# Patient Record
Sex: Female | Born: 2000 | Race: Black or African American | Hispanic: No | Marital: Single | Smoking: Never smoker
Health system: Southern US, Community
[De-identification: ages and names within clinical notes are randomized; demographics above are authoritative.]

## PROBLEM LIST (undated history)

## (undated) DIAGNOSIS — Z789 Other specified health status: Secondary | ICD-10-CM

## (undated) DIAGNOSIS — J302 Other seasonal allergic rhinitis: Secondary | ICD-10-CM

## (undated) HISTORY — DX: Other specified health status: Z78.9

---
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
## 2021-03-21 ENCOUNTER — Ambulatory Visit: Payer: Medicaid Other | Admitting: Adult Health

## 2021-04-23 NOTE — L&D Delivery Note (Signed)
Delivery Note CNM to BS for FHR decels. 10/100/+2. Pushed well x 2- minutes. NICU at delivery for severe FGR, abnormal dopplers and decels. At 4:40 PM a viable and healthy female was delivered via Vaginal, Spontaneous (Presentation: Right Occiput Anterior).  APGAR: 7, 8; weight 3 lb 10.2 oz (1650 g). Shoulders delivered easily. Infant placed skin-to-skin w/ mom. Delayed cord clamping x 1 minute. Cord clamped x 2 and cut by mom. Baby handed off to NICU team due to small size. Placenta status: Spontaneous; Pathology, Intact.  Cord: 3 vessels with the following complications:   Anesthesia: Epidural Episiotomy: None Lacerations: None Suture Repair:  NA Est. Blood Loss (mL): 151  Mom to postpartum.  Baby to NICU. Placenta to: Pathology Feeding: Br/Bo Circ: NA Contraception: Depo  [place] 02/13/2022, 5:43 PM

## 2021-05-10 ENCOUNTER — Encounter (HOSPITAL_COMMUNITY): Payer: Self-pay

## 2021-05-10 ENCOUNTER — Emergency Department (HOSPITAL_COMMUNITY)
Admission: EM | Admit: 2021-05-10 | Discharge: 2021-05-10 | Disposition: A | Discharge status: Home / Self Care | Payer: Medicaid Other | Attending: Emergency Medicine | Admitting: Emergency Medicine

## 2021-05-10 ENCOUNTER — Other Ambulatory Visit: Payer: Self-pay

## 2021-05-10 DIAGNOSIS — B349 Viral infection, unspecified: Secondary | ICD-10-CM | POA: Insufficient documentation

## 2021-05-10 DIAGNOSIS — R519 Headache, unspecified: Secondary | ICD-10-CM | POA: Diagnosis present

## 2021-05-10 DIAGNOSIS — H00011 Hordeolum externum right upper eyelid: Secondary | ICD-10-CM | POA: Diagnosis not present

## 2021-05-10 DIAGNOSIS — Z20822 Contact with and (suspected) exposure to covid-19: Secondary | ICD-10-CM | POA: Insufficient documentation

## 2021-05-10 DIAGNOSIS — R000 Tachycardia, unspecified: Secondary | ICD-10-CM | POA: Diagnosis not present

## 2021-05-10 LAB — RESP PANEL BY RT-PCR (FLU A&B, COVID) ARPGX2
Influenza A by PCR: NEGATIVE
Influenza B by PCR: NEGATIVE
SARS Coronavirus 2 by RT PCR: NEGATIVE

## 2021-05-10 MED ORDER — ERYTHROMYCIN 5 MG/GM OP OINT
TOPICAL_OINTMENT | Freq: Once | OPHTHALMIC | Status: AC
  Administered 2021-05-10: 1 via OPHTHALMIC
  Filled 2021-05-10: qty 3.5

## 2021-05-10 NOTE — Discharge Instructions (Addendum)
Please refer to the attached instructions. Apply ointment to eye 4-6 times per day. Tylenol or ibuprofen for headache/discomfort.

## 2021-05-10 NOTE — ED Provider Notes (Signed)
[facility] EMERGENCY DEPARTMENT Provider Note   CSN: [id number] Arrival date & time: 05/10/21  1946     History  Chief Complaint  Patient presents with   Headache    Monica Kramer is a 21 y.o. female.  Patient presents with complaint of a stye to the right upper eyelid, onset several days ago. Today she developed a headache, chills, and malaise. No known sick contacts.  The history is provided by the patient. No language interpreter was used.  Headache Pain location:  Generalized Quality:  Dull Onset quality:  Gradual Duration:  1 day Progression:  Waxing and waning Chronicity:  New Associated symptoms: fever and nausea   Associated symptoms: no blurred vision, no cough, no diarrhea, no sore throat and no vomiting       Home Medications Prior to Admission medications   Medication Sig Start Date End Date Taking? Authorizing Provider  albuterol (VENTOLIN HFA) 108 (90 Base) MCG/ACT inhaler Inhale 1-2 puffs into the lungs every 6 (six) hours as needed for wheezing or shortness of breath. 02/04/20   Long, Wonda Olds, MD  metroNIDAZOLE (FLAGYL) 500 MG tablet Take 1 tablet (500 mg total) by mouth 2 (two) times daily. 09/14/19   Estill Dooms, NP  ofloxacin (OCUFLOX) 0.3 % ophthalmic solution Place 1 drop into the right eye 4 (four) times daily. 02/08/20   Avegno, Darrelyn Hillock, FNP      Allergies    Tomato    Review of Systems   Review of Systems  Constitutional:  Positive for fever.  HENT:  Negative for sore throat.   Eyes:  Negative for blurred vision.  Respiratory:  Negative for cough.   Gastrointestinal:  Positive for nausea. Negative for diarrhea and vomiting.  Neurological:  Positive for headaches.  All other systems reviewed and are negative.  Physical Exam Updated Vital Signs BP 137/81 (BP Location: Right Arm)    Pulse (!) 104    Temp 99.3 F (37.4 C) (Oral)    Resp 18    Ht 5\' 3"  (1.6 m)    Wt 67.1 kg    LMP  (LMP Unknown)    SpO2 100%    BMI 26.22 kg/m   Physical Exam Constitutional:      Appearance: Normal appearance.  HENT:     Head: Normocephalic.     Mouth/Throat:     Mouth: Mucous membranes are moist.  Eyes:     Conjunctiva/sclera: Conjunctivae normal.     Comments: Stye right upper lid.  Cardiovascular:     Rate and Rhythm: Tachycardia present.     Pulses: Normal pulses.  Pulmonary:     Effort: Pulmonary effort is normal.     Breath sounds: Normal breath sounds.  Abdominal:     Palpations: Abdomen is soft.  Musculoskeletal:        General: Normal range of motion.  Lymphadenopathy:     Cervical: No cervical adenopathy.  Skin:    General: Skin is warm and dry.  Neurological:     Mental Status: She is alert and oriented to person, place, and time.     Motor: Motor function is intact.     Coordination: Coordination is intact.  Psychiatric:        Mood and Affect: Mood normal.        Behavior: Behavior normal.    ED Results / Procedures / Treatments   Labs (all labs ordered are listed, but only abnormal results are displayed) Labs Reviewed  RESP PANEL  BY RT-PCR (FLU A&B, COVID) [id number]    EKG None  Radiology No results found.  Procedures Procedures    Medications Ordered in ED Medications  erythromycin ophthalmic ointment (1 application Right Eye Given 05/10/21 2151)    ED Course/ Medical Decision Making/ A&P                           Medical Decision Making Labs reviewed. Patient negative for COVID, influenza, and RSV.  Mild tachycardia likely due to fever. No chest pain or shortness of breath.  Patient with stye to right upper lid. No vision changes. Initiated erythromycin ointment. Care instructions provided.  Pt symptoms consistent with viral illness.  Pt will be discharged with symptomatic treatment.  Discussed return precautions.  Pt is hemodynamically stable & in NAD prior to discharge.         Final Clinical Impression(s) / ED Diagnoses Final diagnoses:  Viral infection  Hordeolum  externum of right upper eyelid    Rx / DC Orders ED Discharge Orders     None         Etta Quill, NP 05/10/21 2207    Lajean Saver, MD 05/10/21 2256

## 2021-05-10 NOTE — ED Triage Notes (Signed)
Pt states that she developed a stye to the R eye a few days ago and has not gotten any better. Pt states that today she started developing a headache and "being hot and cold". Denies fever but states that she has not felt good today. Denies any sick interactions.

## 2021-05-11 ENCOUNTER — Telehealth: Payer: Self-pay

## 2021-05-11 NOTE — Telephone Encounter (Signed)
Transition Care Management Unsuccessful Follow-up Telephone Call ° °Date of discharge and from where:  01/18//2023 from [place] ° °Attempts:  1st Attempt ° °Reason for unsuccessful TCM follow-up call:  Left voice message ° ° ° °

## 2021-05-12 NOTE — Telephone Encounter (Signed)
Transition Care Management Follow-up Telephone Call Date of discharge and from where: 05/10/2021 from [facility] How have you been since you were released from the hospital? Pt stated that she is feeling the same. Pt stated that she has not had any fever but has started coughing and she is still experiencing fatigue. OTC meds taking is tylenol and benadryl.  Any questions or concerns? No  Items Reviewed: Did the pt receive and understand the discharge instructions provided? Yes  Medications obtained and verified? Yes  Other? No  Any new allergies since your discharge? No  Dietary orders reviewed? No Do you have support at home? Yes   Functional Questionnaire: (I = Independent and D = Dependent) ADLs: I  Bathing/Dressing- I  Meal Prep- I  Eating- I  Maintaining continence- I  Transferring/Ambulation- I  Managing Meds- I   Follow up appointments reviewed:  PCP Hospital f/u appt confirmed? No   Specialist Hospital f/u appt confirmed? No   Are transportation arrangements needed? No  If their condition worsens, is the pt aware to call PCP or go to the Emergency Dept.? Yes Was the patient provided with contact information for the PCP's office or ED? Yes Was to pt encouraged to call back with questions or concerns? Yes

## 2021-05-13 ENCOUNTER — Emergency Department (HOSPITAL_COMMUNITY)
Admission: EM | Admit: 2021-05-13 | Discharge: 2021-05-13 | Disposition: A | Discharge status: Home / Self Care | Payer: Medicaid Other | Attending: Emergency Medicine | Admitting: Emergency Medicine

## 2021-05-13 ENCOUNTER — Encounter (HOSPITAL_COMMUNITY): Payer: Self-pay | Admitting: *Deleted

## 2021-05-13 DIAGNOSIS — B9789 Other viral agents as the cause of diseases classified elsewhere: Secondary | ICD-10-CM | POA: Diagnosis not present

## 2021-05-13 DIAGNOSIS — Z3202 Encounter for pregnancy test, result negative: Secondary | ICD-10-CM | POA: Insufficient documentation

## 2021-05-13 DIAGNOSIS — R000 Tachycardia, unspecified: Secondary | ICD-10-CM | POA: Insufficient documentation

## 2021-05-13 DIAGNOSIS — R519 Headache, unspecified: Secondary | ICD-10-CM | POA: Diagnosis present

## 2021-05-13 DIAGNOSIS — J069 Acute upper respiratory infection, unspecified: Secondary | ICD-10-CM | POA: Insufficient documentation

## 2021-05-13 DIAGNOSIS — R590 Localized enlarged lymph nodes: Secondary | ICD-10-CM | POA: Diagnosis not present

## 2021-05-13 LAB — GROUP A STREP BY PCR: Group A Strep by PCR: NOT DETECTED

## 2021-05-13 MED ORDER — BENZONATATE 200 MG PO CAPS: 200.0000 mg | ORAL_CAPSULE | Freq: Three times a day (TID) | ORAL | 0 refills | Status: AC

## 2021-05-13 NOTE — ED Notes (Signed)
URINE Pregnancy is NEGATIVE

## 2021-05-13 NOTE — ED Provider Notes (Signed)
[facility] EMERGENCY DEPARTMENT Provider Note   CSN: [id number] Arrival date & time: 05/13/21  1109     History  No chief complaint on file.   Monica Kramer is a 21 y.o. female.  21 year old female presents emergency room with complaint of sore throat with headache, congestion.  States her symptoms started a few days ago with cough and congestion, came to the ER and had a negative COVID/flu test.  Patient is not taking anything for her symptoms beyond ibuprofen.  States that she is unable to go to work due to feeling unwell.  Denies history of chronic lung disease or other medical history.      Home Medications Prior to Admission medications   Medication Sig Start Date End Date Taking? Authorizing Provider  benzonatate (TESSALON) 200 MG capsule Take 1 capsule (200 mg total) by mouth every 8 (eight) hours for 10 days. 05/13/21 05/23/21 Yes Jeannie Fend, PA-C  albuterol (VENTOLIN HFA) 108 (90 Base) MCG/ACT inhaler Inhale 1-2 puffs into the lungs every 6 (six) hours as needed for wheezing or shortness of breath. 02/04/20   Long, Arlyss Repress, MD  metroNIDAZOLE (FLAGYL) 500 MG tablet Take 1 tablet (500 mg total) by mouth 2 (two) times daily. 09/14/19   Adline Potter, NP  ofloxacin (OCUFLOX) 0.3 % ophthalmic solution Place 1 drop into the right eye 4 (four) times daily. 02/08/20   Avegno, Zachery Dakins, FNP      Allergies    Tomato    Review of Systems   Review of Systems  Constitutional:  Negative for chills and fever.  HENT:  Positive for congestion and sore throat.   Respiratory:  Positive for cough.   Gastrointestinal:  Negative for nausea and vomiting.  Musculoskeletal:  Negative for arthralgias and myalgias.  Skin:  Negative for rash and wound.  Allergic/Immunologic: Negative for immunocompromised state.  Neurological:  Positive for headaches.  Hematological:  Positive for adenopathy.  Psychiatric/Behavioral:  Negative for confusion.   All other systems reviewed and  are negative.  Physical Exam Updated Vital Signs BP 139/89 (BP Location: Right Arm)    Pulse 99    Temp 98.9 F (37.2 C) (Oral)    Resp 16    LMP  (LMP Unknown)    SpO2 100%  Physical Exam Vitals and nursing note reviewed.  Constitutional:      General: She is not in acute distress.    Appearance: She is well-developed. She is not diaphoretic.  HENT:     Head: Normocephalic and atraumatic.     Right Ear: Tympanic membrane and ear canal normal.     Left Ear: Tympanic membrane and ear canal normal.     Nose: Congestion present.     Mouth/Throat:     Mouth: Mucous membranes are moist.     Pharynx: Oropharyngeal exudate and posterior oropharyngeal erythema present.     Comments: Erythematous oropharynx and tonsils with slight exudate noted to right tonsil. Eyes:     Conjunctiva/sclera: Conjunctivae normal.  Neck:     Comments: Mildly tender left anterior cervical lymphadenopathy Cardiovascular:     Rate and Rhythm: Regular rhythm. Tachycardia present.     Heart sounds: Normal heart sounds.  Pulmonary:     Effort: Pulmonary effort is normal.     Breath sounds: Normal breath sounds.  Musculoskeletal:     Cervical back: Neck supple.  Lymphadenopathy:     Cervical: Cervical adenopathy present.  Skin:    General: Skin is warm and  dry.     Findings: No erythema or rash.  Neurological:     Mental Status: She is alert and oriented to person, place, and time.  Psychiatric:        Behavior: Behavior normal.    ED Results / Procedures / Treatments   Labs (all labs ordered are listed, but only abnormal results are displayed) Labs Reviewed  GROUP A STREP BY PCR  PREGNANCY, URINE    EKG None  Radiology No results found.  Procedures Procedures    Medications Ordered in ED Medications - No data to display  ED Course/ Medical Decision Making/ A&P                           Medical Decision Making Amount and/or Complexity of Data Reviewed Labs:  ordered.  Risk Prescription drug management.   21 year old female with complaint of sore throat, cough, congestion as above.  On exam, is found to have erythematous tonsils with slight exudate to right tonsil with tender left anterior lymph node however strep test is negative.  Patient seen a few days ago with negative COVID and flu test.  Not taking anything OTC and states she could possibly be pregnant.  Recommend pregnancy test and vitals reassessed prior to discharge with recommendations for management of her likely viral URI.        Final Clinical Impression(s) / ED Diagnoses Final diagnoses:  Viral URI with cough  Negative pregnancy test    Rx / DC Orders ED Discharge Orders          Ordered    benzonatate (TESSALON) 200 MG capsule  Every 8 hours        05/13/21 1606              Jeannie Fend, PA-C 05/13/21 1607    Linwood Dibbles, MD 05/15/21 1221

## 2021-05-13 NOTE — Discharge Instructions (Addendum)
Home to rest.  Recommend NyQuil and DayQuil for cold symptoms.  If you need additional cough relief, prescription for Kimberlee Nearing has been sent to your pharmacy. Sure to drink plenty of hydrating fluids.  Recheck with your doctor if not improving.

## 2021-05-13 NOTE — ED Triage Notes (Signed)
Sore throat onset yesterday

## 2021-05-15 ENCOUNTER — Telehealth: Payer: Self-pay

## 2021-05-15 NOTE — Telephone Encounter (Signed)
Transition Care Management Unsuccessful Follow-up Telephone Call ° °Date of discharge and from where:  05/13/2021-[facility]  ° °Attempts:  1st Attempt ° °Reason for unsuccessful TCM follow-up call:  Unable to reach patient ° °  °

## 2021-05-16 NOTE — Telephone Encounter (Signed)
Transition Care Management Follow-up Telephone Call Date of discharge and from where: 05/14/2020 from [facility].  How have you been since you were released from the hospital? Pt stated that she was feeling some better. Pt was sleeping when I called.  Any questions or concerns? No  Items Reviewed: Did the pt receive and understand the discharge instructions provided? No  Medications obtained and verified? No  Other? No  Any new allergies since your discharge? No  Dietary orders reviewed? No Do you have support at home?  Not asked  Functional Questionnaire: (I = Independent and D = Dependent) ADLs: I  Bathing/Dressing- I  Meal Prep- I  Eating- I  Maintaining continence- I  Transferring/Ambulation- I  Managing Meds- I   Follow up appointments reviewed:  PCP Hospital f/u appt confirmed? No  Asked patient to call me back if she was interested in est. Care.  Specialist Hospital f/u appt confirmed? No  Are transportation arrangements needed? No  If their condition worsens, is the pt aware to call PCP or go to the Emergency Dept.? Yes Was the patient provided with contact information for the PCP's office or ED? Yes Was to pt encouraged to call back with questions or concerns? Yes

## 2021-07-13 ENCOUNTER — Other Ambulatory Visit: Payer: Medicaid Other

## 2021-07-23 ENCOUNTER — Emergency Department (HOSPITAL_COMMUNITY)
Admission: EM | Admit: 2021-07-23 | Discharge: 2021-07-23 | Disposition: A | Discharge status: Home / Self Care | Payer: Medicaid Other | Attending: Emergency Medicine | Admitting: Emergency Medicine

## 2021-07-23 ENCOUNTER — Encounter (HOSPITAL_COMMUNITY): Payer: Self-pay | Admitting: Emergency Medicine

## 2021-07-23 ENCOUNTER — Other Ambulatory Visit: Payer: Self-pay

## 2021-07-23 DIAGNOSIS — O219 Vomiting of pregnancy, unspecified: Secondary | ICD-10-CM | POA: Diagnosis not present

## 2021-07-23 DIAGNOSIS — Z3A01 Less than 8 weeks gestation of pregnancy: Secondary | ICD-10-CM | POA: Diagnosis not present

## 2021-07-23 LAB — I-STAT BETA HCG BLOOD, ED (MC, WL, AP ONLY): I-stat hCG, quantitative: >2000 m[IU]/mL — ABNORMAL HIGH (ref <5)

## 2021-07-23 MED ORDER — ONDANSETRON HCL 4 MG/2ML IJ SOLN: 4.0000 mg | Freq: Once | INTRAMUSCULAR | Status: DC

## 2021-07-23 MED ORDER — SODIUM CHLORIDE 0.9 % IV BOLUS: 1000.0000 mL | Freq: Once | INTRAVENOUS | Status: DC

## 2021-07-23 MED ORDER — ONDANSETRON 8 MG PO TBDP
8.0000 mg | ORAL_TABLET | Freq: Once | ORAL | Status: AC
  Administered 2021-07-23: 8 mg via ORAL
  Filled 2021-07-23: qty 1

## 2021-07-23 MED ORDER — ONDANSETRON 4 MG PO TBDP: 4.0000 mg | ORAL_TABLET | Freq: Three times a day (TID) | ORAL | 0 refills | Status: DC | PRN

## 2021-07-23 NOTE — Discharge Instructions (Signed)
It is important that you follow-up at our local OB, gynecology office.  Return here for concerning changes in your condition. ?

## 2021-07-23 NOTE — ED Triage Notes (Signed)
Pt reports she is unsure when her last period was but believes it may have been Feb; pt reports emesis the past 2 mornings, reports she had a positive home pregnancy test x 1 week ago; has not seen OBGYN  ?

## 2021-07-23 NOTE — ED Provider Notes (Signed)
?[facility] ?Provider Note ? ? ?CSN: [id number] ?Arrival date & time: 07/23/21  1217 ? ?  ? ?History ? ?Chief Complaint  ?Patient presents with  ? Possible Pregnancy  ? ? ?Monica Kramer is a 21 y.o. female. ? ?HPI ?Patient presents with concern of nausea, anorexia, occasional vomiting.  Last menstrual period was about 6 weeks ago.  She hypothesizes about pregnancy.  She has no pain, no lightheadedness, no dysuria, no urinary frequency.  She notes that over the past few days that she has had episodic nausea, vomited yesterday.  She is not taking any medication for relief, has not seen her physician. ? ?  ? ?Home Medications ?Prior to Admission medications   ?Medication Sig Start Date End Date Taking? Authorizing Provider  ?ondansetron (ZOFRAN-ODT) 4 MG disintegrating tablet Take 1 tablet (4 mg total) by mouth every 8 (eight) hours as needed for nausea or vomiting. 07/23/21  Yes Carmin Muskrat, MD  ?albuterol (VENTOLIN HFA) 108 (90 Base) MCG/ACT inhaler Inhale 1-2 puffs into the lungs every 6 (six) hours as needed for wheezing or shortness of breath. 02/04/20   Long, Wonda Olds, MD  ?metroNIDAZOLE (FLAGYL) 500 MG tablet Take 1 tablet (500 mg total) by mouth 2 (two) times daily. 09/14/19   Estill Dooms, NP  ?ofloxacin (OCUFLOX) 0.3 % ophthalmic solution Place 1 drop into the right eye 4 (four) times daily. 02/08/20   Emerson Monte, FNP  ?   ? ?Allergies    ?Tomato   ? ?Review of Systems   ?Review of Systems  ?Constitutional:   ?     Per HPI, otherwise negative  ?HENT:    ?     Per HPI, otherwise negative  ?Respiratory:    ?     Per HPI, otherwise negative  ?Cardiovascular:   ?     Per HPI, otherwise negative  ?Gastrointestinal:  Positive for nausea and vomiting. Negative for abdominal pain.  ?Endocrine:  ?     Negative aside from HPI  ?Genitourinary:   ?     Neg aside from HPI   ?Musculoskeletal:   ?     Per HPI, otherwise negative  ?Skin: Negative.   ?Neurological:  Negative for syncope.   ? ?Physical Exam ?Updated Vital Signs ?BP 120/83   Pulse 97   Temp 98.1 ?F (36.7 ?C) (Oral)   Resp 16   Ht 5\' 3"  (1.6 m)   Wt 69.9 kg   LMP 06/07/2021 (Approximate)   SpO2 100%   BMI 27.32 kg/m?  ?Physical Exam ?Vitals and nursing note reviewed.  ?Constitutional:   ?   General: She is not in acute distress. ?   Appearance: She is well-developed.  ?HENT:  ?   Head: Normocephalic and atraumatic.  ?Eyes:  ?   Conjunctiva/sclera: Conjunctivae normal.  ?Cardiovascular:  ?   Rate and Rhythm: Normal rate and regular rhythm.  ?Pulmonary:  ?   Effort: Pulmonary effort is normal. No respiratory distress.  ?   Breath sounds: Normal breath sounds. No stridor.  ?Abdominal:  ?   General: There is no distension.  ?   Tenderness: There is no abdominal tenderness. There is no guarding or rebound. Negative signs include Murphy's sign and McBurney's sign.  ?Skin: ?   General: Skin is warm and dry.  ?Neurological:  ?   Mental Status: She is alert and oriented to person, place, and time.  ?   Cranial Nerves: No cranial nerve deficit.  ?Psychiatric:     ?  Mood and Affect: Mood normal.  ? ? ?ED Results / Procedures / Treatments   ?Labs ?(all labs ordered are listed, but only abnormal results are displayed) ?Labs Reviewed  ?I-STAT BETA HCG BLOOD, ED ([facility], [facility], [facility] ONLY) - Abnormal; Notable for the following components:  ?    Result Value  ? I-stat hCG, quantitative >2,000.0 (*)   ? All other components within normal limits  ?COMPREHENSIVE METABOLIC PANEL  ?CBC WITH DIFFERENTIAL/PLATELET  ?URINALYSIS, ROUTINE W REFLEX MICROSCOPIC  ?HCG, QUANTITATIVE, PREGNANCY  ? ? ?EKG ?None ? ?Radiology ?No results found. ? ?Procedures ?Procedures  ? ? ?Medications Ordered in ED ?Medications  ?sodium chloride 0.9 % bolus 1,000 mL (has no administration in time range)  ?ondansetron (ZOFRAN) injection 4 mg (has no administration in time range)  ?ondansetron (ZOFRAN-ODT) disintegrating tablet 8 mg (8 mg Oral Given 07/23/21 1251)  ? ? ?ED Course/  Medical Decision Making/ A&P ?This patient with a Hx of no medical problems, no prior pregnancies presents to the ED for concern of nausea, vomiting, time lapse since most recent menstrual cycle, this involves an extensive number of treatment options, and is a complaint that carries with it a high risk of complications and morbidity.   ? ?The differential diagnosis includes pregnancy, nausea vomiting in pregnancy, gastritis, gastroenteritis, dehydration ? ? ?Social Determinants of Health: ? ?Youth ? ? ?After the initial evaluation, orders, including: Fluids, antiemetics, urine, blood were initiated. ? ? ?Patient placed on Cardiac and Pulse-Oximetry Monitors. ?The patient was maintained on a cardiac monitor.  The cardiac monitored showed an rhythm of 90 sinus normal ?The patient was also maintained on pulse oximetry. The readings were typically 100% room air normal ? ? ?On repeat evaluation of the patient improved ? ?Lab Tests: ? ?I personally interpreted labs.  The pertinent results include: Pregnancy positive ? ? ?Dispostion / Final MDM: ? ?After consideration of the diagnostic results and the patient's response to treatment, she has no ongoing nausea, has not vomited.  After initial pregnancy test positive, she and I had a lengthy conversation about additional studies, utility of same as well as possible fluids.  Patient declined IV access, additional meds, fluids, labs.  As she is awake and alert, hemodynamically unremarkable, without substantial tachycardia or hypotension, low suspicion for hypovolemia or complication that may contribute to complication of early pregnancy.  Patient amenable to stopping her vaping, following up as an outpatient. ? ?Final Clinical Impression(s) / ED Diagnoses ?Final diagnoses:  ?Nausea and vomiting in pregnancy  ? ? ?Rx / DC Orders ?ED Discharge Orders   ? ?      Ordered  ?  ondansetron (ZOFRAN-ODT) 4 MG disintegrating tablet  Every 8 hours PRN       ? 07/23/21 1306  ? ?  ?  ? ?   ? ? ?  ?Carmin Muskrat, MD ?07/23/21 1323 ? ?

## 2021-07-28 ENCOUNTER — Telehealth: Payer: Self-pay

## 2021-07-28 NOTE — Telephone Encounter (Signed)
Transition Care Management Unsuccessful Follow-up Telephone Call ? ?Date of discharge and from where:  07/23/2021-[facility]  ? ?Attempts:  2nd Attempt ? ?Reason for unsuccessful TCM follow-up call:  Unable to reach patient ? ?  ?

## 2021-07-31 NOTE — Telephone Encounter (Signed)
Transition Care Management Unsuccessful Follow-up Telephone Call ? ?Date of discharge and from where:  07/23/2021-[facility] ? ?Attempts:  3rd Attempt ? ?Reason for unsuccessful TCM follow-up call:  Unable to reach patient ? ?  ?

## 2021-08-07 ENCOUNTER — Other Ambulatory Visit: Payer: Self-pay | Admitting: Obstetrics & Gynecology

## 2021-08-07 DIAGNOSIS — O3680X0 Pregnancy with inconclusive fetal viability, not applicable or unspecified: Secondary | ICD-10-CM

## 2021-08-08 ENCOUNTER — Other Ambulatory Visit: Payer: Medicaid Other

## 2021-08-12 IMAGING — DX DG CHEST 1V PORT
1 series · 1 of 1 positions shown · non-contrast
Comparison: July 17, 2004

CLINICAL DATA: Cough and congestion

EXAM:
PORTABLE CHEST 1 VIEW

[chest ap]
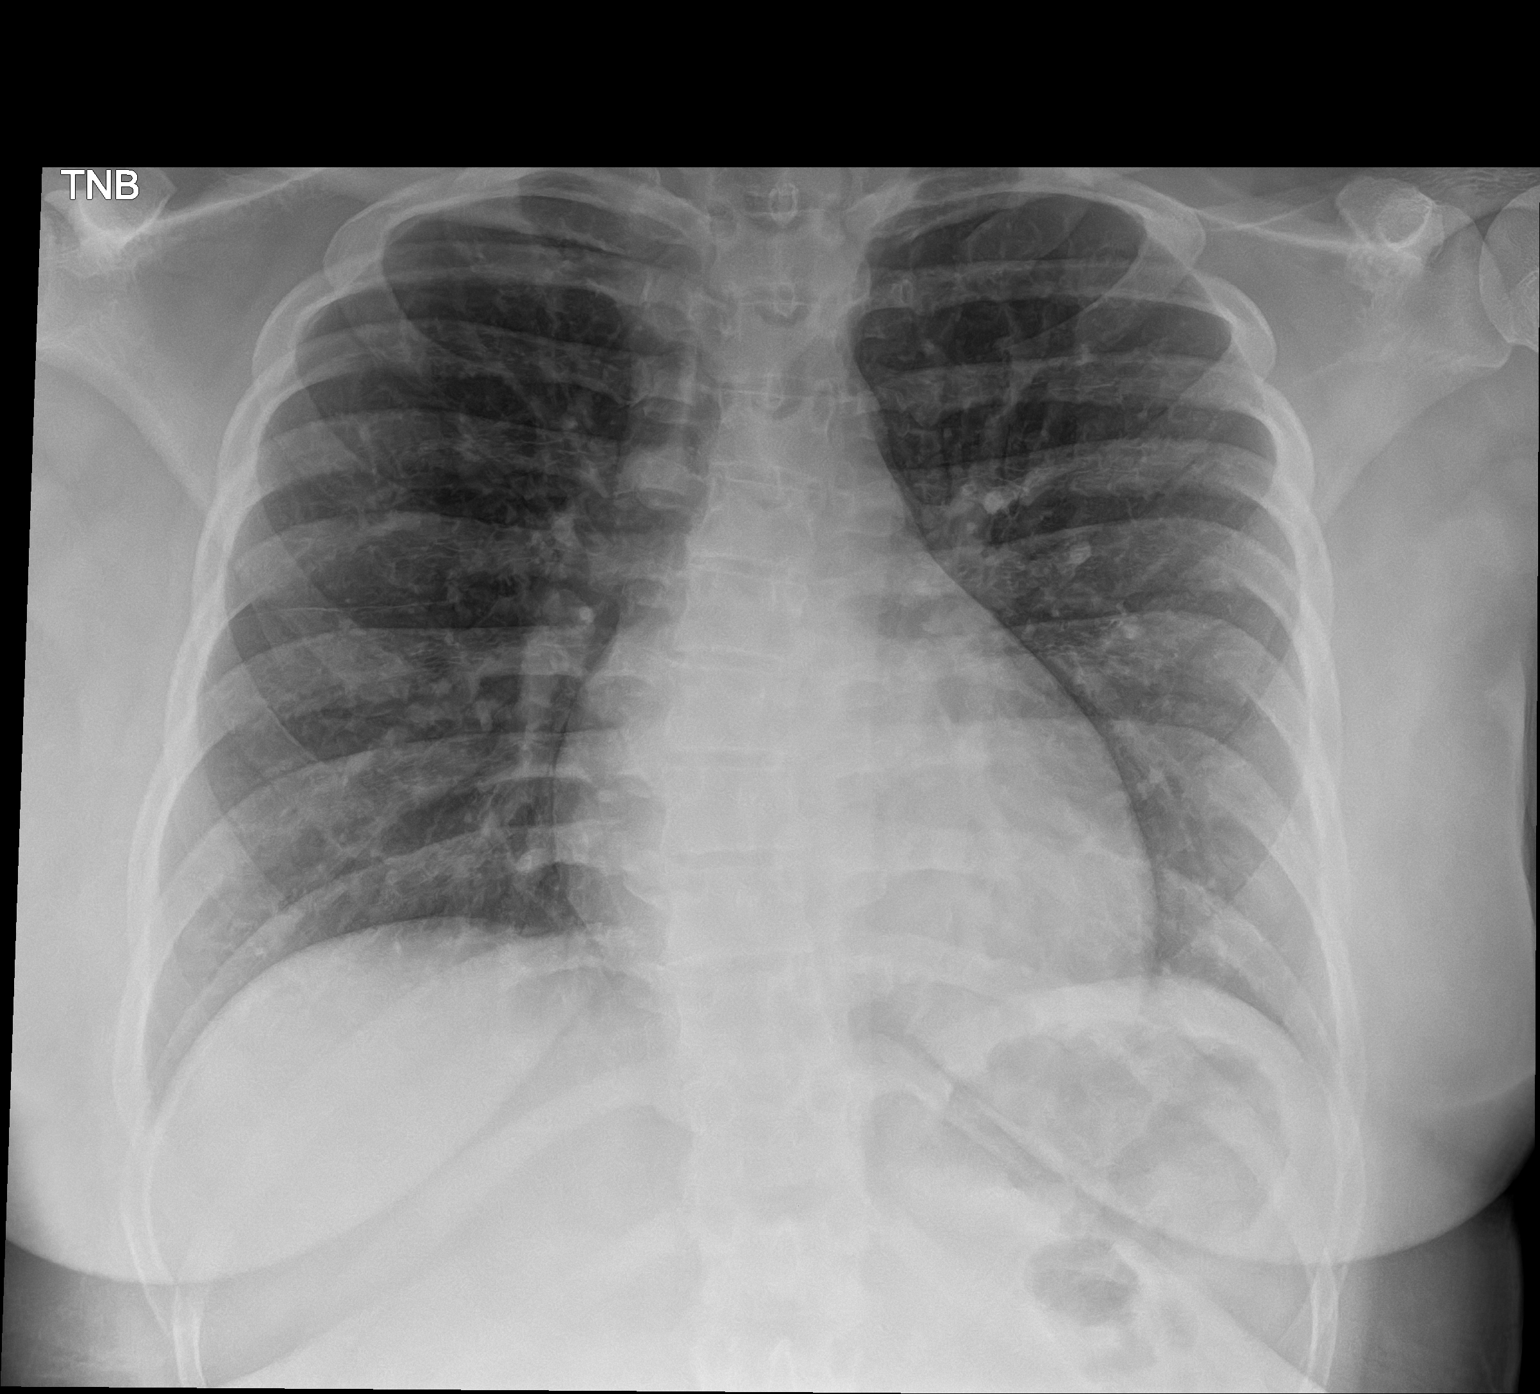

[1 of 1 positions shown; findings below may reference images not displayed]

FINDINGS: The lungs are clear. Heart is upper normal in size with pulmonary
vascularity normal. No adenopathy. There is lower thoracic
dextroscoliosis.
IMPRESSION: Lungs clear.  Heart upper normal in size.  No adenopathy.

## 2021-08-15 ENCOUNTER — Other Ambulatory Visit: Payer: Medicaid Other

## 2021-08-15 ENCOUNTER — Ambulatory Visit (INDEPENDENT_AMBULATORY_CARE_PROVIDER_SITE_OTHER): Payer: Medicaid Other

## 2021-08-15 DIAGNOSIS — O3680X0 Pregnancy with inconclusive fetal viability, not applicable or unspecified: Secondary | ICD-10-CM | POA: Diagnosis not present

## 2021-08-15 NOTE — Progress Notes (Signed)
[place] 11 wks,single IUP,CRL 40.62 mm,FHR 152 bpm,normal ovaries ?

## 2021-08-21 ENCOUNTER — Other Ambulatory Visit: Payer: Medicaid Other

## 2021-08-23 DIAGNOSIS — O0990 Supervision of high risk pregnancy, unspecified, unspecified trimester: Secondary | ICD-10-CM | POA: Insufficient documentation

## 2021-08-23 DIAGNOSIS — Z3400 Encounter for supervision of normal first pregnancy, unspecified trimester: Secondary | ICD-10-CM | POA: Insufficient documentation

## 2021-08-24 ENCOUNTER — Other Ambulatory Visit: Payer: Self-pay | Admitting: Obstetrics & Gynecology

## 2021-08-24 DIAGNOSIS — Z3682 Encounter for antenatal screening for nuchal translucency: Secondary | ICD-10-CM

## 2021-08-25 ENCOUNTER — Encounter: Payer: Medicaid Other | Admitting: Obstetrics & Gynecology

## 2021-08-25 ENCOUNTER — Other Ambulatory Visit: Payer: Medicaid Other

## 2021-08-25 ENCOUNTER — Ambulatory Visit: Payer: Medicaid Other | Admitting: *Deleted

## 2021-08-25 DIAGNOSIS — Z3401 Encounter for supervision of normal first pregnancy, first trimester: Secondary | ICD-10-CM

## 2021-09-20 ENCOUNTER — Ambulatory Visit: Payer: Medicaid Other | Admitting: *Deleted

## 2021-09-20 ENCOUNTER — Encounter: Payer: Self-pay | Admitting: Advanced Practice Midwife

## 2021-09-20 ENCOUNTER — Ambulatory Visit (INDEPENDENT_AMBULATORY_CARE_PROVIDER_SITE_OTHER): Payer: Medicaid Other | Admitting: Advanced Practice Midwife

## 2021-09-20 VITALS — BP 113/62 | HR 73 | Wt 152.0 lb

## 2021-09-20 DIAGNOSIS — Z3A16 16 weeks gestation of pregnancy: Secondary | ICD-10-CM | POA: Diagnosis not present

## 2021-09-20 DIAGNOSIS — Z3682 Encounter for antenatal screening for nuchal translucency: Secondary | ICD-10-CM

## 2021-09-20 DIAGNOSIS — Z363 Encounter for antenatal screening for malformations: Secondary | ICD-10-CM | POA: Diagnosis not present

## 2021-09-20 DIAGNOSIS — Z3482 Encounter for supervision of other normal pregnancy, second trimester: Secondary | ICD-10-CM | POA: Diagnosis not present

## 2021-09-20 DIAGNOSIS — Z3402 Encounter for supervision of normal first pregnancy, second trimester: Secondary | ICD-10-CM

## 2021-09-20 LAB — POCT URINALYSIS DIPSTICK OB
Blood, UA: NEGATIVE
Glucose, UA: NEGATIVE
Ketones, UA: NEGATIVE
Leukocytes, UA: NEGATIVE
Nitrite, UA: NEGATIVE
POC,PROTEIN,UA: NEGATIVE

## 2021-09-20 MED ORDER — BLOOD PRESSURE MONITOR MISC: 0 refills | Status: DC

## 2021-09-20 NOTE — Patient Instructions (Signed)
Monica Kramer, thank you for choosing our office today! We appreciate the opportunity to meet your healthcare needs. You may receive a short survey by mail, e-mail, or through [employer]. If you are happy with your care we would appreciate if you could take just a few minutes to complete the survey questions. We read all of your comments and take your feedback very seriously. Thank you again for choosing our office.  Center for [employer] [facility] Team at [facility]  [facility] at [facility] ([address] [place], [place] [address]) Entrance C, located off of [address] Free 24/7 valet parking   Nausea & Vomiting Have saltine crackers or pretzels by your bed and eat a few bites before you raise your head out of bed in the morning Eat small frequent meals throughout the day instead of large meals Drink plenty of fluids throughout the day to stay hydrated, just don't drink a lot of fluids with your meals.  This can make your stomach fill up faster making you feel sick Do not brush your teeth right after you eat Products with real ginger are good for nausea, like ginger ale and ginger hard candy Make sure it says made with real ginger! Sucking on sour candy like lemon heads is also good for nausea If your prenatal vitamins make you nauseated, take them at night so you will sleep through the nausea Sea Bands If you feel like you need medicine for the nausea & vomiting please let us know If you are unable to keep any fluids or food down please let us know   Constipation Drink plenty of fluid, preferably water, throughout the day Eat foods high in fiber such as fruits, vegetables, and grains Exercise, such as walking, is a good way to keep your bowels regular Drink warm fluids, especially warm prune juice, or decaf coffee Eat a 1/2 cup of real oatmeal (not instant), 1/2 cup applesauce, and 1/2-1 cup warm prune juice every day If needed, you may take Colace (docusate sodium) stool softener  once or twice a day to help keep the stool soft.  If you still are having problems with constipation, you may take Miralax once daily as needed to help keep your bowels regular.   Home Blood Pressure Monitoring for Patients   Your provider has recommended that you check your blood pressure (BP) at least once a week at home. If you do not have a blood pressure cuff at home, one will be provided for you. Contact your provider if you have not received your monitor within 1 week.   Helpful Tips for Accurate Home Blood Pressure Checks  Don't smoke, exercise, or drink caffeine 30 minutes before checking your BP Use the restroom before checking your BP (a full bladder can raise your pressure) Relax in a comfortable upright chair Feet on the ground Left arm resting comfortably on a flat surface at the level of your heart Legs uncrossed Back supported Sit quietly and don't talk Place the cuff on your bare arm Adjust snuggly, so that only two fingertips can fit between your skin and the top of the cuff Check 2 readings separated by at least one minute Keep a log of your BP readings For a visual, please reference this diagram: http://ccnc.care/bpdiagram  Provider Name: [facility]     Phone: [phone]  Zone 1: ALL CLEAR  Continue to monitor your symptoms:  BP reading is less than 140 (top number) or less than 90 (bottom  number)  No right upper stomach pain No headaches or seeing spots No feeling nauseated or throwing up No swelling in face and hands  Zone 2: CAUTION Call your doctor's office for any of the following:  BP reading is greater than 140 (top number) or greater than 90 (bottom number)  Stomach pain under your ribs in the middle or right side Headaches or seeing spots Feeling nauseated or throwing up Swelling in face and hands  Zone 3: EMERGENCY  Seek immediate medical care if you have any of the following:  BP reading is greater than160 (top number) or greater than  110 (bottom number) Severe headaches not improving with Tylenol Serious difficulty catching your breath Any worsening symptoms from Zone 2    First Trimester of Pregnancy The first trimester of pregnancy is from week 1 until the end of week 12 (months 1 through 3). A week after a sperm fertilizes an egg, the egg will implant on the wall of the uterus. This embryo will begin to develop into a baby. Genes from you and your partner are forming the baby. The female genes determine whether the baby is a boy or a girl. At 6-8 weeks, the eyes and face are formed, and the heartbeat can be seen on ultrasound. At the end of 12 weeks, all the baby's organs are formed.  Now that you are pregnant, you will want to do everything you can to have a healthy baby. Two of the most important things are to get good prenatal care and to follow your health care provider's instructions. Prenatal care is all the medical care you receive before the baby's birth. This care will help prevent, find, and treat any problems during the pregnancy and childbirth. BODY CHANGES Your body goes through many changes during pregnancy. The changes vary from woman to woman.  You may gain or lose a couple of pounds at first. You may feel sick to your stomach (nauseous) and throw up (vomit). If the vomiting is uncontrollable, call your health care provider. You may tire easily. You may develop headaches that can be relieved by medicines approved by your health care provider. You may urinate more often. Painful urination may mean you have a bladder infection. You may develop heartburn as a result of your pregnancy. You may develop constipation because certain hormones are causing the muscles that push waste through your intestines to slow down. You may develop hemorrhoids or swollen, bulging veins (varicose veins). Your breasts may begin to grow larger and become tender. Your nipples may stick out more, and the tissue that surrounds them  (areola) may become darker. Your gums may bleed and may be sensitive to brushing and flossing. Dark spots or blotches (chloasma, mask of pregnancy) may develop on your face. This will likely fade after the baby is born. Your menstrual periods will stop. You may have a loss of appetite. You may develop cravings for certain kinds of food. You may have changes in your emotions from day to day, such as being excited to be pregnant or being concerned that something may go wrong with the pregnancy and baby. You may have more vivid and strange dreams. You may have changes in your hair. These can include thickening of your hair, rapid growth, and changes in texture. Some women also have hair loss during or after pregnancy, or hair that feels dry or thin. Your hair will most likely return to normal after your baby is born. WHAT TO EXPECT AT YOUR PRENATAL  VISITS During a routine prenatal visit: You will be weighed to make sure you and the baby are growing normally. Your blood pressure will be taken. Your abdomen will be measured to track your baby's growth. The fetal heartbeat will be listened to starting around week 10 or 12 of your pregnancy. Test results from any previous visits will be discussed. Your health care provider may ask you: How you are feeling. If you are feeling the baby move. If you have had any abnormal symptoms, such as leaking fluid, bleeding, severe headaches, or abdominal cramping. If you have any questions. Other tests that may be performed during your first trimester include: Blood tests to find your blood type and to check for the presence of any previous infections. They will also be used to check for low iron levels (anemia) and Rh antibodies. Later in the pregnancy, blood tests for diabetes will be done along with other tests if problems develop. Urine tests to check for infections, diabetes, or protein in the urine. An ultrasound to confirm the proper growth and development  of the baby. An amniocentesis to check for possible genetic problems. Fetal screens for spina bifida and Down syndrome. You may need other tests to make sure you and the baby are doing well. HOME CARE INSTRUCTIONS  Medicines Follow your health care provider's instructions regarding medicine use. Specific medicines may be either safe or unsafe to take during pregnancy. Take your prenatal vitamins as directed. If you develop constipation, try taking a stool softener if your health care provider approves. Diet Eat regular, well-balanced meals. Choose a variety of foods, such as meat or vegetable-based protein, fish, milk and low-fat dairy products, vegetables, fruits, and whole grain breads and cereals. Your health care provider will help you determine the amount of weight gain that is right for you. Avoid raw meat and uncooked cheese. These carry germs that can cause birth defects in the baby. Eating four or five small meals rather than three large meals a day may help relieve nausea and vomiting. If you start to feel nauseous, eating a few soda crackers can be helpful. Drinking liquids between meals instead of during meals also seems to help nausea and vomiting. If you develop constipation, eat more high-fiber foods, such as fresh vegetables or fruit and whole grains. Drink enough fluids to keep your urine clear or pale yellow. Activity and Exercise Exercise only as directed by your health care provider. Exercising will help you: Control your weight. Stay in shape. Be prepared for labor and delivery. Experiencing pain or cramping in the lower abdomen or low back is a good sign that you should stop exercising. Check with your health care provider before continuing normal exercises. Try to avoid standing for long periods of time. Move your legs often if you must stand in one place for a long time. Avoid heavy lifting. Wear low-heeled shoes, and practice good posture. You may continue to have sex  unless your health care provider directs you otherwise. Relief of Pain or Discomfort Wear a good support bra for breast tenderness.   Take warm sitz baths to soothe any pain or discomfort caused by hemorrhoids. Use hemorrhoid cream if your health care provider approves.   Rest with your legs elevated if you have leg cramps or low back pain. If you develop varicose veins in your legs, wear support hose. Elevate your feet for 15 minutes, 3-4 times a day. Limit salt in your diet. Prenatal Care Schedule your prenatal visits by the  twelfth week of pregnancy. They are usually scheduled monthly at first, then more often in the last 2 months before delivery. Write down your questions. Take them to your prenatal visits. Keep all your prenatal visits as directed by your health care provider. Safety Wear your seat belt at all times when driving. Make a list of emergency phone numbers, including numbers for family, friends, the hospital, and police and fire departments. General Tips Ask your health care provider for a referral to a local prenatal education class. Begin classes no later than at the beginning of month 6 of your pregnancy. Ask for help if you have counseling or nutritional needs during pregnancy. Your health care provider can offer advice or refer you to specialists for help with various needs. Do not use hot tubs, steam rooms, or saunas. Do not douche or use tampons or scented sanitary pads. Do not cross your legs for long periods of time. Avoid cat litter boxes and soil used by cats. These carry germs that can cause birth defects in the baby and possibly loss of the fetus by miscarriage or stillbirth. Avoid all smoking, herbs, alcohol, and medicines not prescribed by your health care provider. Chemicals in these affect the formation and growth of the baby. Schedule a dentist appointment. At home, brush your teeth with a soft toothbrush and be gentle when you floss. SEEK MEDICAL CARE IF:   You have dizziness. You have mild pelvic cramps, pelvic pressure, or nagging pain in the abdominal area. You have persistent nausea, vomiting, or diarrhea. You have a bad smelling vaginal discharge. You have pain with urination. You notice increased swelling in your face, hands, legs, or ankles. SEEK IMMEDIATE MEDICAL CARE IF:  You have a fever. You are leaking fluid from your vagina. You have spotting or bleeding from your vagina. You have severe abdominal cramping or pain. You have rapid weight gain or loss. You vomit blood or material that looks like coffee grounds. You are exposed to [place] measles and have never had them. You are exposed to fifth disease or chickenpox. You develop a severe headache. You have shortness of breath. You have any kind of trauma, such as from a fall or a car accident. Document Released: 04/03/2001 Document Revised: 08/24/2013 Document Reviewed: 02/17/2013 [facility] Patient Information 2015 [place], [place]. This information is not intended to replace advice given to you by your health care provider. Make sure you discuss any questions you have with your health care provider.

## 2021-09-20 NOTE — Progress Notes (Signed)
INITIAL OBSTETRICAL VISIT Patient name: Monica Kramer MRN [id number]  Date of birth: Aug 25, 2000 Chief Complaint:   Initial Prenatal Visit (nausea)  History of Present Illness:   Monica Kramer is a 21 y.o. G60P0000 African-American female at [redacted]w[redacted]d by [place] at 87 weeks with an Estimated Date of Delivery: 03/06/22 being seen today for her initial obstetrical visit.   Patient's last menstrual period was 06/07/2021 (approximate). Her obstetrical history is significant for primigravida.   Today she reports no complaints.  Last pap <21yo. Results were: N/A     09/20/2021    2:07 PM 09/07/2020   11:19 AM 11/13/2017    1:16 PM  Depression screen PHQ 2/9  Decreased Interest 2 1 0  Down, Depressed, Hopeless 0 1 0  PHQ - 2 Score 2 2 0  Altered sleeping 0 1 0  Tired, decreased energy 1 0 0  Change in appetite 1 0 0  Feeling bad or failure about yourself  0 0 0  Trouble concentrating 0 0 0  Moving slowly or fidgety/restless 0 0 0  Suicidal thoughts 0 0 0  PHQ-9 Score 4 3 0  Difficult doing work/chores   Not difficult at all        09/20/2021    2:07 PM 09/07/2020   11:19 AM  GAD 7 : Generalized Anxiety Score  Nervous, Anxious, on Edge 0 0  Control/stop worrying 0 2  Worry too much - different things 0 2  Trouble relaxing 0 1  Restless 0 0  Easily annoyed or irritable 2 2  Afraid - awful might happen 0 0  Total GAD 7 Score 2 7     Review of Systems:   Pertinent items are noted in HPI Denies cramping/contractions, leakage of fluid, vaginal bleeding, abnormal vaginal discharge w/ itching/odor/irritation, headaches, visual changes, shortness of breath, chest pain, abdominal pain, severe nausea/vomiting, or problems with urination or bowel movements unless otherwise stated above.  Pertinent History Reviewed:  Reviewed past medical,surgical, social, obstetrical and family history.  Reviewed problem list, medications and allergies. OB History  Gravida Para Term Preterm AB Living   1 0 0 0 0 0  SAB IAB Ectopic Multiple Live Births  0 0 0 0 0    # Outcome Date GA Lbr Len/2nd Weight Sex Delivery Anes PTL Lv  1 Current            Physical Assessment:   Vitals:   09/20/21 1356  BP: 113/62  Pulse: 73  Weight: 152 lb (68.9 kg)  Body mass index is 26.93 kg/m.       Physical Examination:  General appearance - well appearing, and in no distress  Mental status - alert, oriented to person, place, and time  Psych:  She has a normal mood and affect  Skin - warm and dry, normal color, no suspicious lesions noted  Chest - effort normal, all lung fields clear to auscultation bilaterally  Heart - normal rate and regular rhythm  Abdomen - soft, nontender; FHTs 150bpm  Extremities:  No swelling or varicosities noted  Pelvic - not indicated  Thin prep pap is not done    TODAY'S NT (too late)  No results found for this or any previous visit (from the past 24 hour(s)).  Assessment & Plan:  1) Low-Risk Pregnancy G1P0000 at [redacted]w[redacted]d with an Estimated Date of Delivery: 03/06/22   2) Initial OB visit   Meds:  Meds ordered this encounter  Medications   Blood Pressure  Monitor MISC    Sig: For regular home bp monitoring during pregnancy    Dispense:  1 each    Refill:  0    Z34.81 Please mail to patient    Initial labs obtained Continue prenatal vitamins Reviewed n/v relief measures and warning s/s to report Reviewed recommended weight gain based on pre-gravid BMI Encouraged well-balanced diet Genetic & carrier screening discussed: too late for NT/IT, requests Panorama and AFP Ultrasound discussed; fetal survey: requested [place] completed> form faxed if has or is planning to apply for medicaid The nature of [place] for [employer] with multiple MDs and other Advanced Practice Providers was explained to patient; also emphasized that fellows, residents, and students are part of our team. Does have home bp cuff. Office bp cuff given: no. Rx sent: yes.  Check bp weekly, let us know if consistently >140/90.   No indications for ASA therapy or early HgbA1c (per uptodate)   Follow-up: Return in about 4 weeks (around 10/18/2021) for Vera, [place]: Anatomy, in person.   Orders Placed This Encounter  Procedures   Urine Culture   GC/Chlamydia Probe Amp   US OB Comp + 14 Wk   Genetic Screening   CBC/D/Plt+RPR+Rh+ABO+RubIgG...   AFP, Serum, Open Spina Bifida   POC Urinalysis Dipstick OB    Myrtis Ser [facility] 09/20/2021 2:41 PM

## 2021-09-22 ENCOUNTER — Other Ambulatory Visit: Payer: Self-pay | Admitting: Advanced Practice Midwife

## 2021-09-22 ENCOUNTER — Encounter: Payer: Self-pay | Admitting: Advanced Practice Midwife

## 2021-09-22 DIAGNOSIS — Z3402 Encounter for supervision of normal first pregnancy, second trimester: Secondary | ICD-10-CM

## 2021-09-22 DIAGNOSIS — R8271 Bacteriuria: Secondary | ICD-10-CM | POA: Insufficient documentation

## 2021-09-22 DIAGNOSIS — A749 Chlamydial infection, unspecified: Secondary | ICD-10-CM | POA: Insufficient documentation

## 2021-09-22 LAB — CBC/D/PLT+RPR+RH+ABO+RUBIGG...
Antibody Screen: NEGATIVE
Basophils Absolute: 0 10*3/uL (ref 0.0–0.2)
Basos: 1 %
EOS (ABSOLUTE): 0.1 10*3/uL (ref 0.0–0.4)
Eos: 2 %
HCV Ab: NONREACTIVE
HIV Screen 4th Generation wRfx: NONREACTIVE
Hematocrit: 35 % (ref 34.0–46.6)
Hemoglobin: 11.6 g/dL (ref 11.1–15.9)
Hepatitis B Surface Ag: NEGATIVE
Immature Grans (Abs): 0 10*3/uL (ref 0.0–0.1)
Immature Granulocytes: 0 %
Lymphocytes Absolute: 3 10*3/uL (ref 0.7–3.1)
Lymphs: 46 %
MCH: 29.4 pg (ref 26.6–33.0)
MCHC: 33.1 g/dL (ref 31.5–35.7)
MCV: 89 fL (ref 79–97)
Monocytes Absolute: 0.6 10*3/uL (ref 0.1–0.9)
Monocytes: 9 %
Neutrophils Absolute: 2.8 10*3/uL (ref 1.4–7.0)
Neutrophils: 42 %
Platelets: 282 10*3/uL (ref 150–450)
RBC: 3.95 x10E6/uL (ref 3.77–5.28)
RDW: 14.6 % (ref 11.7–15.4)
RPR Ser Ql: NONREACTIVE
Rh Factor: POSITIVE
Rubella Antibodies, IGG: 1.22 index (ref >0.99)
WBC: 6.5 10*3/uL (ref 3.4–10.8)

## 2021-09-22 LAB — GC/CHLAMYDIA PROBE AMP
Chlamydia trachomatis, NAA: POSITIVE — AB
Neisseria Gonorrhoeae by PCR: NEGATIVE

## 2021-09-22 LAB — AFP, SERUM, OPEN SPINA BIFIDA
AFP MoM: 1.29
AFP Value: 49 ng/mL
Gest. Age on Collection Date: 16.1 weeks
Maternal Age At EDD: 20.4 yr
OSBR Risk 1 IN: 9894
Test Results:: NEGATIVE
Weight: 152 [lb_av]

## 2021-09-22 LAB — URINE CULTURE

## 2021-09-22 LAB — HCV INTERPRETATION

## 2021-09-22 MED ORDER — AZITHROMYCIN 250 MG PO TABS: 1000.0000 mg | ORAL_TABLET | Freq: Once | ORAL | 0 refills | Status: AC

## 2021-09-22 MED ORDER — PENICILLIN V POTASSIUM 500 MG PO TABS: 500.0000 mg | ORAL_TABLET | Freq: Four times a day (QID) | ORAL | 0 refills | Status: AC

## 2021-09-26 ENCOUNTER — Telehealth: Payer: Self-pay | Admitting: *Deleted

## 2021-09-26 NOTE — Telephone Encounter (Signed)
Patient made aware she tested positive for chlamydia. Medication has been sent to her pharmacy.  Advised partner needs treatment by either [place], PCP or HD.  No sex for 7 days until after both are treated.  Patient states she will inform him and see where he will be treated.  Also made aware urine was positive for UTI.  Medication for that was sent in as well. Should take all of the medication even if symptoms are gone.  Pt verbalized understanding with no further questions.

## 2021-10-02 ENCOUNTER — Encounter: Payer: Self-pay | Admitting: Advanced Practice Midwife

## 2021-10-23 ENCOUNTER — Ambulatory Visit (INDEPENDENT_AMBULATORY_CARE_PROVIDER_SITE_OTHER): Payer: Medicaid Other

## 2021-10-23 ENCOUNTER — Ambulatory Visit (INDEPENDENT_AMBULATORY_CARE_PROVIDER_SITE_OTHER): Payer: Medicaid Other | Admitting: Obstetrics & Gynecology

## 2021-10-23 ENCOUNTER — Encounter: Payer: Self-pay | Admitting: Obstetrics & Gynecology

## 2021-10-23 VITALS — BP 111/69 | HR 59 | Wt 153.5 lb

## 2021-10-23 DIAGNOSIS — O0992 Supervision of high risk pregnancy, unspecified, second trimester: Secondary | ICD-10-CM

## 2021-10-23 DIAGNOSIS — Z3402 Encounter for supervision of normal first pregnancy, second trimester: Secondary | ICD-10-CM

## 2021-10-23 DIAGNOSIS — O283 Abnormal ultrasonic finding on antenatal screening of mother: Secondary | ICD-10-CM

## 2021-10-23 DIAGNOSIS — Z3A20 20 weeks gestation of pregnancy: Secondary | ICD-10-CM

## 2021-10-23 DIAGNOSIS — O365990 Maternal care for other known or suspected poor fetal growth, unspecified trimester, not applicable or unspecified: Secondary | ICD-10-CM

## 2021-10-23 DIAGNOSIS — Z363 Encounter for antenatal screening for malformations: Secondary | ICD-10-CM | POA: Diagnosis not present

## 2021-10-23 DIAGNOSIS — A749 Chlamydial infection, unspecified: Secondary | ICD-10-CM

## 2021-10-23 DIAGNOSIS — R8271 Bacteriuria: Secondary | ICD-10-CM

## 2021-10-23 NOTE — Progress Notes (Signed)
[place] 20+6 wks,breech,posterior placenta gr 0,normal ovaries,cx 2.8 cm,svp of fluid 4 cm,FHR 152 bpm,echogenic bowel,small stomach bubble,LVEIC,RVEICF,EFW 254 g .45%,limited view because of fetal position,need additional images of the heart

## 2021-10-23 NOTE — Progress Notes (Signed)
HIGH-RISK PREGNANCY VISIT Patient name: Monica Kramer MRN [id number]  Date of birth: 04/25/2000 Chief Complaint:   Routine Prenatal Visit ([place] today)  History of Present Illness:   Monica Kramer is a 21 y.o. G52P0000 female at [redacted]w[redacted]d with an Estimated Date of Delivery: 03/06/22 being seen today for ongoing management of a high-risk pregnancy complicated by fetal growth restriction .25%.    Today she reports no complaints. Contractions: Not present. Vag. Bleeding: None.  Movement: Present. denies leaking of fluid.      09/20/2021    2:07 PM 09/07/2020   11:19 AM 11/13/2017    1:16 PM  Depression screen PHQ 2/9  Decreased Interest 2 1 0  Down, Depressed, Hopeless 0 1 0  PHQ - 2 Score 2 2 0  Altered sleeping 0 1 0  Tired, decreased energy 1 0 0  Change in appetite 1 0 0  Feeling bad or failure about yourself  0 0 0  Trouble concentrating 0 0 0  Moving slowly or fidgety/restless 0 0 0  Suicidal thoughts 0 0 0  PHQ-9 Score 4 3 0  Difficult doing work/chores   Not difficult at all        09/20/2021    2:07 PM 09/07/2020   11:19 AM  GAD 7 : Generalized Anxiety Score  Nervous, Anxious, on Edge 0 0  Control/stop worrying 0 2  Worry too much - different things 0 2  Trouble relaxing 0 1  Restless 0 0  Easily annoyed or irritable 2 2  Afraid - awful might happen 0 0  Total GAD 7 Score 2 7     Review of Systems:   Pertinent items are noted in HPI Denies abnormal vaginal discharge w/ itching/odor/irritation, headaches, visual changes, shortness of breath, chest pain, abdominal pain, severe nausea/vomiting, or problems with urination or bowel movements unless otherwise stated above. Pertinent History Reviewed:  Reviewed past medical,surgical, social, obstetrical and family history.  Reviewed problem list, medications and allergies. Physical Assessment:   Vitals:   10/23/21 1225  BP: 111/69  Pulse: (!) 59  Weight: 153 lb 8 oz (69.6 kg)  Body mass index is 27.19 kg/m.            Physical Examination:   General appearance: alert, well appearing, and in no distress  Mental status: alert, oriented to person, place, and time  Skin: warm & dry   Extremities: Edema: None    Cardiovascular: normal heart rate noted  Respiratory: normal respiratory effort, no distress  Abdomen: gravid, soft, non-tender  Pelvic: Cervical exam deferred         Fetal Status:     Movement: Present    Fetal Surveillance Testing today: sonogram   Chaperone: Angel Neas    No results found for this or any previous visit (from the past 24 hour(s)).  Assessment & Plan:  High-risk pregnancy: G1P0000 at [redacted]w[redacted]d with an Estimated Date of Delivery: 03/06/22      ICD-10-CM   1. High-risk pregnancy in second trimester  O09.92     2. Fetal growth restriction antepartum  O36.5990 RPR    Rubella screen    Toxoplasma gondii antibody, IgG    HSV 1 antibody, IgG    HSV 2 antibody, IgG    Cmv antibody, IgG (EIA)    3. Echogenic bowel of fetus on prenatal ultrasound  O28.3         Meds: No orders of the defined types were placed in this encounter.  Orders:  Orders Placed This Encounter  Procedures   RPR   Rubella screen   Toxoplasma gondii antibody, IgG   HSV 1 antibody, IgG   HSV 2 antibody, IgG   Cmv antibody, IgG (EIA)     Labs/procedures today: U/S  Treatment Plan:  Dopplers to begin at next sonogram TORCH titers and will require significant antenatal surveillance   Follow-up: Return in about 4 weeks (around 11/20/2021) for sonogram + see Dr Despina Hidden for ob visit.   Future Appointments  Date Time Provider Department Center  12/08/2021  8:30 AM [facility]  12/11/2021  3:00 PM [facility] [place] [facility] None  12/11/2021  3:50 PM Cheral Marker, CNM [facility]  12/14/2021  3:30 PM [facility] NURSE [facility]  12/18/2021  2:15 PM [facility] [place] [facility] None  12/18/2021  3:10 PM Myna Hidalgo, DO [facility]  12/21/2021  3:10 PM [facility]  NURSE [facility]  12/26/2021  2:15 PM [facility] [place] [facility] None  12/26/2021  4:10 PM Lazaro Arms, MD [facility]  12/28/2021  3:30 PM [facility] NURSE [facility]  01/01/2022  1:30 PM [facility] [place] [facility] None  01/01/2022  2:30 PM Myna Hidalgo, DO [facility]  01/04/2022  3:30 PM [facility] NURSE [facility]  01/08/2022  2:15 PM [facility] [place] [facility] None  01/08/2022  3:10 PM Cheral Marker, CNM [facility]  01/11/2022  3:10 PM [facility] NURSE [facility]  01/15/2022  1:30 PM [facility] [place] [facility] None  01/15/2022  2:30 PM Lazaro Arms, MD [facility]  01/18/2022  3:30 PM [facility] NURSE [facility]  01/22/2022  2:15 PM [facility] [place] [facility] None  01/22/2022  3:10 PM Lazaro Arms, MD [facility]  01/25/2022  3:30 PM [facility] NURSE [facility]  01/29/2022  1:30 PM [facility] [place] [facility] None  01/29/2022  2:30 PM Myna Hidalgo, DO [facility]  02/01/2022  3:10 PM [facility] NURSE [facility]  02/06/2022  1:30 PM [facility] [place] [facility] None  02/06/2022  2:30 PM Lazaro Arms, MD [facility]  02/08/2022  9:50 AM [facility] NURSE [facility]  02/12/2022  1:30 PM [facility] [place] [facility] None  02/12/2022  2:30 PM Lazaro Arms, MD [facility]  02/15/2022  3:30 PM [facility] NURSE [facility]  02/19/2022  1:30 PM [facility] [place] [facility] None  02/19/2022  2:30 PM Myna Hidalgo, DO [facility]  02/22/2022  3:30 PM [facility] NURSE [facility]  02/26/2022  1:30 PM [facility] [place] [facility] None  02/26/2022  2:30 PM Lazaro Arms, MD [facility]  03/01/2022  3:30 PM [facility] NURSE [facility]  03/05/2022  1:30 PM [facility] [place] [facility] None  03/05/2022  2:30 PM Nelson Cohick, Amaryllis Dyke, MD [facility]    Orders Placed This Encounter  Procedures   RPR   Rubella screen   Toxoplasma gondii antibody, IgG   HSV 1 antibody, IgG   HSV 2 antibody, IgG   Cmv antibody, IgG (EIA)    Lazaro Arms  Attending Physician for the Center for [facility] 12/07/2021 4:54 PM

## 2021-11-06 ENCOUNTER — Other Ambulatory Visit: Payer: Self-pay

## 2021-11-06 ENCOUNTER — Encounter (HOSPITAL_COMMUNITY): Payer: Self-pay

## 2021-11-06 ENCOUNTER — Emergency Department (HOSPITAL_COMMUNITY)
Admission: EM | Admit: 2021-11-06 | Discharge: 2021-11-06 | Disposition: A | Discharge status: Home / Self Care | Payer: Medicaid Other | Attending: Emergency Medicine | Admitting: Emergency Medicine

## 2021-11-06 DIAGNOSIS — W57XXXA Bitten or stung by nonvenomous insect and other nonvenomous arthropods, initial encounter: Secondary | ICD-10-CM | POA: Diagnosis not present

## 2021-11-06 DIAGNOSIS — S90464A Insect bite (nonvenomous), right lesser toe(s), initial encounter: Secondary | ICD-10-CM | POA: Diagnosis not present

## 2021-11-06 DIAGNOSIS — S90861A Insect bite (nonvenomous), right foot, initial encounter: Secondary | ICD-10-CM | POA: Diagnosis not present

## 2021-11-06 DIAGNOSIS — Y92002 Bathroom of unspecified non-institutional (private) residence single-family (private) house as the place of occurrence of the external cause: Secondary | ICD-10-CM | POA: Diagnosis not present

## 2021-11-06 NOTE — ED Triage Notes (Signed)
Patient presents to ED c/o bee sting to right foot. Reports that she was getting ready for work and a bee stung her right foot in the bathroom. Denies shortness of breath or rash.

## 2021-11-06 NOTE — Discharge Instructions (Signed)
Recommend over-the-counter pain medication as needed for pain, may use Claritin and or Pepcid as needed for itchiness.  May place ice to the area and this can help with swelling and pain.  Follow-up with PCP as needed.  Come back to the emergency department if you develop chest pain, shortness of breath, severe abdominal pain, uncontrolled nausea, vomiting, diarrhea.

## 2021-11-06 NOTE — ED Provider Notes (Signed)
[facility] EMERGENCY DEPARTMENT Provider Note   CSN: [id number] Arrival date & time: 11/06/21  1638     History  Chief Complaint  Patient presents with   Insect Bite    Monica Kramer is a 21 y.o. female.  HPI  Without medical history who presents after a bee sting.  Patient states that she was getting ready for work and a bee stung her right foot, should be she is starting on the right little toe, states she has pain in that area but denies itchiness or swelling, no systemic rash no tongue throat lip swelling difficulty breathing no GI symptoms never been stung by bee in the past.  She has no other complaints, up-to-date on her tetanus shot.  States that she is here because she needs a work note as this happened while she is not at work.  Home Medications Prior to Admission medications   Medication Sig Start Date End Date Taking? Authorizing Provider  Blood Pressure Monitor MISC For regular home bp monitoring during pregnancy Patient not taking: Reported on 10/23/2021 09/20/21   Arabella Merles, CNM  ondansetron (ZOFRAN-ODT) 4 MG disintegrating tablet Take 1 tablet (4 mg total) by mouth every 8 (eight) hours as needed for nausea or vomiting. 07/23/21   Gerhard Munch, MD  Prenatal MV & Min w/FA-DHA (PRENATAL GUMMIES PO) Take by mouth.    [provider]      Allergies    Tomato    Review of Systems   Review of Systems  Constitutional:  Negative for chills and fever.  Respiratory:  Negative for shortness of breath.   Cardiovascular:  Negative for chest pain.  Gastrointestinal:  Negative for abdominal pain.  Skin:  Positive for wound.  Neurological:  Negative for headaches.    Physical Exam Updated Vital Signs BP 118/73 (BP Location: Left Arm)   Pulse (!) 106   Temp 98.8 F (37.1 C) (Oral)   Resp 16   Ht 5\' 3"  (1.6 m)   Wt 69.4 kg   LMP 06/07/2021 (Approximate)   SpO2 100%   BMI 27.10 kg/m  Physical Exam Vitals and nursing note reviewed.   Constitutional:      General: She is not in acute distress.    Appearance: She is not ill-appearing.  HENT:     Head: Normocephalic and atraumatic.     Nose: No congestion.     Mouth/Throat:     Mouth: Mucous membranes are moist.     Pharynx: Oropharynx is clear. No oropharyngeal exudate or posterior oropharyngeal erythema.     Comments: No facial swelling no trismus no torticollis tongue and uvula both midline controlling oral secretions tonsils were equal symmetric bilaterally no submandibular swelling. Eyes:     Conjunctiva/sclera: Conjunctivae normal.  Cardiovascular:     Rate and Rhythm: Normal rate and regular rhythm.     Pulses: Normal pulses.     Heart sounds: No murmur heard.    No friction rub. No gallop.  Pulmonary:     Effort: No respiratory distress.     Breath sounds: No wheezing, rhonchi or rales.  Skin:    General: Skin is warm and dry.     Comments: Right foot was visualized slight redness present without drainage or discharge no fluctuance or induration present.  Limited skin exam was performed but no noted hives or rash on her upper or lower extremities back or abdomen.  Neurological:     Mental Status: She is alert.  Psychiatric:  Mood and Affect: Mood normal.     ED Results / Procedures / Treatments   Labs (all labs ordered are listed, but only abnormal results are displayed) Labs Reviewed - No data to display  EKG None  Radiology No results found.  Procedures Procedures    Medications Ordered in ED Medications - No data to display  ED Course/ Medical Decision Making/ A&P                           Medical Decision Making  This patient presents to the ED for concern of bee sting, this involves an extensive number of treatment options, and is a complaint that carries with it a high risk of complications and morbidity.  The differential diagnosis includes anaphylaxis, angioedema, fracture    Additional history  obtained:  Additional history obtained from N/A External records from outside source obtained and reviewed including N/A   Co morbidities that complicate the patient evaluation  N/A  Social Determinants of Health:  N/A    Lab Tests:  I Ordered, and personally interpreted labs.  The pertinent results include: N/A   Imaging Studies ordered:  I ordered imaging studies including N/A I independently visualized and interpreted imaging which showed N/A I agree with the radiologist interpretation   Cardiac Monitoring:  The patient was maintained on a cardiac monitor.  I personally viewed and interpreted the cardiac monitored which showed an underlying rhythm of: N/A   Medicines ordered and prescription drug management:  I ordered medication including N/A I have reviewed the patients home medicines and have made adjustments as needed  Critical Interventions:  N/A   Reevaluation:  Presents after a bee sting, benign physical exam, agree with plan discharge.  Consultations Obtained:  N/A    Test Considered:  X-ray of right foot-the first suspicion for fracture dislocation low at this time, no deformities present, presentation more consistent likely bee sting.    Rule out Low suspicion for anaphylaxis or angioedema no systemic rash no oral involvement vital signs reassuring no GI symptoms.    Dispostion and problem list  After consideration of the diagnostic results and the patients response to treatment, I feel that the patent would benefit from discharge.  Bee sting-recommend symptom management, follow-up PCP as needed strict return precautions.            Final Clinical Impression(s) / ED Diagnoses Final diagnoses:  Insect bite of lesser toe of right foot, initial encounter    Rx / DC Orders ED Discharge Orders     None         Carroll Sage, PA-C 11/06/21 1823    Vanetta Mulders, MD 11/10/21 1655

## 2021-11-17 ENCOUNTER — Other Ambulatory Visit: Payer: Self-pay | Admitting: Obstetrics & Gynecology

## 2021-11-17 DIAGNOSIS — O365921 Maternal care for other known or suspected poor fetal growth, second trimester, fetus 1: Secondary | ICD-10-CM

## 2021-11-20 ENCOUNTER — Ambulatory Visit (INDEPENDENT_AMBULATORY_CARE_PROVIDER_SITE_OTHER): Payer: Medicaid Other | Admitting: Obstetrics & Gynecology

## 2021-11-20 ENCOUNTER — Ambulatory Visit (INDEPENDENT_AMBULATORY_CARE_PROVIDER_SITE_OTHER): Payer: Medicaid Other

## 2021-11-20 ENCOUNTER — Other Ambulatory Visit: Payer: Self-pay | Admitting: Obstetrics & Gynecology

## 2021-11-20 VITALS — BP 138/76 | HR 75 | Wt 151.0 lb

## 2021-11-20 DIAGNOSIS — A749 Chlamydial infection, unspecified: Secondary | ICD-10-CM

## 2021-11-20 DIAGNOSIS — Z3402 Encounter for supervision of normal first pregnancy, second trimester: Secondary | ICD-10-CM

## 2021-11-20 DIAGNOSIS — O365921 Maternal care for other known or suspected poor fetal growth, second trimester, fetus 1: Secondary | ICD-10-CM | POA: Diagnosis not present

## 2021-11-20 DIAGNOSIS — R8271 Bacteriuria: Secondary | ICD-10-CM

## 2021-11-20 DIAGNOSIS — O0992 Supervision of high risk pregnancy, unspecified, second trimester: Secondary | ICD-10-CM

## 2021-11-20 DIAGNOSIS — Z3A24 24 weeks gestation of pregnancy: Secondary | ICD-10-CM

## 2021-11-20 DIAGNOSIS — O365990 Maternal care for other known or suspected poor fetal growth, unspecified trimester, not applicable or unspecified: Secondary | ICD-10-CM

## 2021-11-20 NOTE — Progress Notes (Signed)
HIGH-RISK PREGNANCY VISIT Patient name: Monica Kramer MRN [id number]  Date of birth: [birth year] Chief Complaint:   Routine Prenatal Visit  History of Present Illness:   Monica Kramer is a 21 y.o. G27P0000 female at [redacted]w[redacted]d with an Estimated Date of Delivery: 03/06/22 being seen today for ongoing management of a high-risk pregnancy complicated by FGR with elevated Doppler flow studies.    Today she reports no complaints. Contractions: Not present. Vag. Bleeding: None.  Movement: Present. denies leaking of fluid.      09/20/2021    2:07 PM 09/07/2020   11:19 AM 11/13/2017    1:16 PM  Depression screen PHQ 2/9  Decreased Interest 2 1 0  Down, Depressed, Hopeless 0 1 0  PHQ - 2 Score 2 2 0  Altered sleeping 0 1 0  Tired, decreased energy 1 0 0  Change in appetite 1 0 0  Feeling bad or failure about yourself  0 0 0  Trouble concentrating 0 0 0  Moving slowly or fidgety/restless 0 0 0  Suicidal thoughts 0 0 0  PHQ-9 Score 4 3 0  Difficult doing work/chores   Not difficult at all        09/20/2021    2:07 PM 09/07/2020   11:19 AM  GAD 7 : Generalized Anxiety Score  Nervous, Anxious, on Edge 0 0  Control/stop worrying 0 2  Worry too much - different things 0 2  Trouble relaxing 0 1  Restless 0 0  Easily annoyed or irritable 2 2  Afraid - awful might happen 0 0  Total GAD 7 Score 2 7     Review of Systems:   Pertinent items are noted in HPI Denies abnormal vaginal discharge w/ itching/odor/irritation, headaches, visual changes, shortness of breath, chest pain, abdominal pain, severe nausea/vomiting, or problems with urination or bowel movements unless otherwise stated above. Pertinent History Reviewed:  Reviewed past medical,surgical, social, obstetrical and family history.  Reviewed problem list, medications and allergies. Physical Assessment:   Vitals:   11/20/21 1548  BP: 138/76  Pulse: 75  Weight: 151 lb (68.5 kg)  Body mass index is 26.75 kg/m.            Physical Examination:   General appearance: alert, well appearing, and in no distress  Mental status: alert, oriented to person, place, and time  Skin: warm & dry   Extremities: Edema: None    Cardiovascular: normal heart rate noted  Respiratory: normal respiratory effort, no distress  Abdomen: gravid, soft, non-tender  Pelvic: Cervical exam deferred         Fetal Status:     Movement: Present    Fetal Surveillance Testing today: sonogram   Chaperone: N/A    No results found for this or any previous visit (from the past 24 hour(s)).  Assessment & Plan:  High-risk pregnancy: G1P0000 at [redacted]w[redacted]d with an Estimated Date of Delivery: 03/06/22      ICD-10-CM   1. High-risk pregnancy in second trimester  O09.92     2. Fetal growth restriction antepartum, severe, early, with elevated Dopplers  O36.5990 RPR    Rubella screen    Toxoplasma gondii antibody, IgG    HSV 1 antibody, IgG    HSV 2 antibody, IgG    Cmv antibody, IgG (EIA)        Meds: No orders of the defined types were placed in this encounter.   Orders:  Orders Placed This Encounter  Procedures   RPR  Rubella screen   Toxoplasma gondii antibody, IgG   HSV 1 antibody, IgG   HSV 2 antibody, IgG   Cmv antibody, IgG (EIA)     Labs/procedures today: U/S  Treatment Plan:  continue weekly Dopplers and then weekly BPP 28 weeks  Reviewed: Preterm labor symptoms and general obstetric precautions including but not limited to vaginal bleeding, contractions, leaking of fluid and fetal movement were reviewed in detail with the patient.  All questions were answered. Does have home bp cuff. Office bp cuff given: not applicable. Check bp weekly, let us know if consistently >140 and/or >90.  Follow-up: Return in about 1 week (around 11/27/2021) for needs weekly Dopplers/sonogram, HROB + see provider.   Future Appointments  Date Time Provider Department Center  12/08/2021  8:30 AM [facility]  12/11/2021  3:00  PM [facility] [place] [facility] None  12/11/2021  3:50 PM Cheral Marker, CNM [facility]  12/14/2021  3:30 PM [facility] NURSE [facility]  12/18/2021  2:15 PM [facility] [place] [facility] None  12/18/2021  3:10 PM Myna Hidalgo, DO [facility]  12/21/2021  3:10 PM [facility] NURSE [facility]  12/26/2021  2:15 PM [facility] [place] [facility] None  12/26/2021  4:10 PM Lazaro Arms, MD [facility]  12/28/2021  3:30 PM [facility] NURSE [facility]  01/01/2022  1:30 PM [facility] [place] [facility] None  01/01/2022  2:30 PM Myna Hidalgo, DO [facility]  01/04/2022  3:30 PM [facility] N[facility]SE [facility]  01/08/2022  2:15 PM [facility] [place] [facility] None  01/08/2022  3:10 PM Cheral Marker, CNM [facility]  01/11/2022  3:10 PM [facility] N[facility]SE [facility]  01/15/2022  1:30 PM [facility] [place] [facility] None  01/15/2022  2:30 PM Lazaro Arms, MD [facility]  01/18/2022  3:30 PM [facility] N[facility]SE [facility]  01/22/2022  2:15 PM [facility] [place] [facility] None  01/22/2022  3:10 PM Lazaro Arms, MD [facility]  01/25/2022  3:30 PM [facility]  01/29/2022  1:30 PM [facility] [place] [facility] None  01/29/2022  2:30 PM Myna Hidalgo, DO [facility]  02/01/2022  3:10 PM [facility] N[facility]  02/06/2022  1:30 PM [facility] [place] [facility] None  02/06/2022  2:30 PM Lazaro Arms, MD [facility]  02/08/2022  9:50 AM [facility] N[facility]SE [facility]  02/12/2022  1:30 PM [facility] [place] [facility] None  02/12/2022  2:30 PM Lazaro Arms, MD [facility]  02/15/2022  3:30 PM [facility] N[facility]  02/19/2022  1:30 PM [facility] [place] [facility] None  02/19/2022  2:30 PM Myna Hidalgo, DO [facility]  02/22/2022  3:30 PM [facility] NURSE [facility]  02/26/2022  1:30 PM [facility] [place] [facility] None  02/26/2022  2:30 PM Lazaro Arms, MD [facility]  03/01/2022  3:30 PM [facility] NURSE [facility]   03/05/2022  1:30 PM [facility] [place] [facility] None  03/05/2022  2:30 PM Fifi Schindler, Amaryllis Dyke, MD [facility]    Orders Placed This Encounter  Procedures   RPR   Rubella screen   Toxoplasma gondii antibody, IgG   HSV 1 antibody, IgG   HSV 2 antibody, IgG   Cmv antibody, IgG (EIA)   Lazaro Arms  Attending Physician for the Center for [facility] 12/07/2021 2:56 PM

## 2021-11-20 NOTE — Progress Notes (Signed)
[place] 24+6 wks,cephalic,posterior placenta gr 0,cx 4 cm,SVP of fluid 4.2 cm,normal ovaries,LVEICF,echogenic bowel,FHR 152 bpm,EFW 483 g .25%, AC .2%

## 2021-11-24 ENCOUNTER — Other Ambulatory Visit: Payer: Self-pay | Admitting: Obstetrics & Gynecology

## 2021-11-24 DIAGNOSIS — O365921 Maternal care for other known or suspected poor fetal growth, second trimester, fetus 1: Secondary | ICD-10-CM

## 2021-11-27 ENCOUNTER — Encounter: Payer: Self-pay | Admitting: Obstetrics & Gynecology

## 2021-11-27 ENCOUNTER — Ambulatory Visit (INDEPENDENT_AMBULATORY_CARE_PROVIDER_SITE_OTHER): Payer: Medicaid Other

## 2021-11-27 ENCOUNTER — Ambulatory Visit (INDEPENDENT_AMBULATORY_CARE_PROVIDER_SITE_OTHER): Payer: Medicaid Other | Admitting: Obstetrics & Gynecology

## 2021-11-27 VITALS — BP 124/70 | HR 78 | Wt 154.2 lb

## 2021-11-27 DIAGNOSIS — R8271 Bacteriuria: Secondary | ICD-10-CM

## 2021-11-27 DIAGNOSIS — B951 Streptococcus, group B, as the cause of diseases classified elsewhere: Secondary | ICD-10-CM

## 2021-11-27 DIAGNOSIS — Z3A25 25 weeks gestation of pregnancy: Secondary | ICD-10-CM

## 2021-11-27 DIAGNOSIS — O2342 Unspecified infection of urinary tract in pregnancy, second trimester: Secondary | ICD-10-CM

## 2021-11-27 DIAGNOSIS — A749 Chlamydial infection, unspecified: Secondary | ICD-10-CM | POA: Diagnosis not present

## 2021-11-27 DIAGNOSIS — Z3402 Encounter for supervision of normal first pregnancy, second trimester: Secondary | ICD-10-CM

## 2021-11-27 DIAGNOSIS — O365921 Maternal care for other known or suspected poor fetal growth, second trimester, fetus 1: Secondary | ICD-10-CM | POA: Diagnosis not present

## 2021-11-27 DIAGNOSIS — O0972 Supervision of high risk pregnancy due to social problems, second trimester: Secondary | ICD-10-CM

## 2021-11-27 LAB — POCT URINALYSIS DIPSTICK OB
Glucose, UA: NEGATIVE
Ketones, UA: NEGATIVE
Nitrite, UA: NEGATIVE
POC,PROTEIN,UA: NEGATIVE

## 2021-11-27 NOTE — Progress Notes (Signed)
[place] 25+6 wks,cephalic,posterior fundal placenta gr 0,SVP of fluid 5.8 cm,cx 3.4 cm,FHR 144 bpm,RI .79,.81,.85=95.7%,bowel echogenicity appear WNL on today scan

## 2021-11-27 NOTE — Progress Notes (Signed)
HIGH-RISK PREGNANCY VISIT Patient name: Monica Kramer MRN [id number]  Date of birth: 2001/04/04 Chief Complaint:   High Risk Gestation ([place] today)  History of Present Illness:   Monica Kramer is a 21 y.o. G37P0000 female at [redacted]w[redacted]d with an Estimated Date of Delivery: 03/06/22 being seen today for ongoing management of a high-risk pregnancy complicated by:  -FGR -Chlamydia -GBS bacteruria  S/p treatment of above infections, plan for [facility] today Today she reports no complaints.   Contractions: Not present. Vag. Bleeding: None.  Movement: Present. denies leaking of fluid.      09/20/2021    2:07 PM 09/07/2020   11:19 AM 11/13/2017    1:16 PM  Depression screen PHQ 2/9  Decreased Interest 2 1 0  Down, Depressed, Hopeless 0 1 0  PHQ - 2 Score 2 2 0  Altered sleeping 0 1 0  Tired, decreased energy 1 0 0  Change in appetite 1 0 0  Feeling bad or failure about yourself  0 0 0  Trouble concentrating 0 0 0  Moving slowly or fidgety/restless 0 0 0  Suicidal thoughts 0 0 0  PHQ-9 Score 4 3 0  Difficult doing work/chores   Not difficult at all     Current Outpatient Medications  Medication Instructions   Blood Pressure Monitor MISC For regular home bp monitoring during pregnancy   Prenatal MV & Min w/FA-DHA (PRENATAL GUMMIES PO) Oral     Review of Systems:   Pertinent items are noted in HPI Denies abnormal vaginal discharge w/ itching/odor/irritation, headaches, visual changes, shortness of breath, chest pain, abdominal pain, severe nausea/vomiting, or problems with urination or bowel movements unless otherwise stated above. Pertinent History Reviewed:  Reviewed past medical,surgical, social, obstetrical and family history.  Reviewed problem list, medications and allergies. Physical Assessment:   Vitals:   11/27/21 1456  BP: 124/70  Pulse: 78  Weight: 154 lb 3.2 oz (69.9 kg)  Body mass index is 27.32 kg/m.           Physical Examination:   General appearance: alert,  well appearing, and in no distress  Mental status: normal mood, behavior, speech, dress, motor activity, and thought processes  Skin: warm & dry   Extremities: Edema: None    Cardiovascular: normal heart rate noted  Respiratory: normal respiratory effort, no distress  Abdomen: gravid, soft, non-tender  Pelvic: Cervical exam deferred         Fetal Status:     Movement: Present    Fetal Surveillance Testing today: [place] 25+6 wks,cephalic,posterior fundal placenta gr 0,SVP of fluid 5.8 cm,cx 3.4 cm,FHR 144 bpm,RI .79,.81,.85=95.7%,bowel echogenicity appear WNL on today scan   Chaperone: N/A    Results for orders placed or performed in visit on 11/27/21 (from the past 24 hour(s))  POC Urinalysis Dipstick OB   Collection Time: 11/27/21  2:59 PM  Result Value Ref Range   Color, UA     Clarity, UA     Glucose, UA Negative Negative   Bilirubin, UA     Ketones, UA neg    Spec Grav, UA     Blood, UA trace    pH, UA     POC,PROTEIN,UA Negative Negative, Trace, Small (1+), Moderate (2+), Large (3+), 4+   Urobilinogen, UA     Nitrite, UA neg    Leukocytes, UA Moderate (2+) (A) Negative   Appearance     Odor       Assessment & Plan:  High-risk pregnancy: G1P0000 at  [redacted]w[redacted]d with an Estimated Date of Delivery: 03/06/22   1) FGR -reviewed findings and discussed weekly dopplers -Elevated dopplers today, will continue to follow -Start NST weekly @ 28wks -BPPs @ 32weeks -delivery depending on above testing/fetal well being  2) +Chlamydia, GBS TOC today  -PN2 due at next available along with TORCH titers  Meds: No orders of the defined types were placed in this encounter.   Labs/procedures today: doppler  Treatment Plan:  as outlined above  Reviewed: Preterm labor symptoms and general obstetric precautions including but not limited to vaginal bleeding, contractions, leaking of fluid and fetal movement were reviewed in detail with the patient.  All questions were answered. Pt has home  bp cuff. Check bp weekly, let us know if >140/90.   Follow-up: Return in about 4 weeks (around 12/25/2021) for needs PN2 ASAP, weekly appts as scheduled.   Future Appointments  Date Time Provider Department Center  12/01/2021  9:10 AM [facility]  12/11/2021  3:00 PM [facility] [place] [facility] None  12/11/2021  3:50 PM Cheral Marker, CNM [facility]  12/14/2021  3:30 PM [facility] NURSE [facility]  12/18/2021  2:15 PM [facility] [place] [facility] None  12/18/2021  3:10 PM Myna Hidalgo, DO [facility]  12/21/2021  3:10 PM [facility] NURSE [facility]  12/26/2021  2:15 PM [facility] [place] [facility] None  12/26/2021  4:10 PM Lazaro Arms, MD [facility]  12/28/2021  3:30 PM [facility] N[facility]SE [facility]  01/01/2022  1:30 PM [facility] [place] [facility] None  01/01/2022  2:30 PM Myna Hidalgo, DO [facility]  01/04/2022  3:30 PM [facility]  01/08/2022  2:15 PM [facility] [place] [facility] None  01/08/2022  3:10 PM Cheral Marker, CNM [facility]  01/15/2022  1:30 PM [facility] [place] [facility] None  01/15/2022  2:30 PM Lazaro Arms, MD [facility]  01/22/2022  2:15 PM [facility] [place] [facility] None  01/22/2022  3:10 PM Lazaro Arms, MD [facility]  01/29/2022  1:30 PM [facility] [place] [facility] None  01/29/2022  2:30 PM Myna Hidalgo, DO [facility]  02/06/2022  1:30 PM [facility] [place] [facility] None  02/06/2022  2:30 PM Lazaro Arms, MD [facility]  02/12/2022  1:30 PM [facility] [place] [facility] None  02/12/2022  2:30 PM Lazaro Arms, MD [facility]  02/19/2022  1:30 PM [facility] [place] [facility] None  02/19/2022  2:30 PM Myna Hidalgo, DO [facility]  02/26/2022  1:30 PM [facility] [place] [facility] None  02/26/2022  2:30 PM Lazaro Arms, MD [facility]  03/05/2022  1:30 PM [facility] [place] [facility] None  03/05/2022  2:30 PM Eure, Amaryllis Dyke, MD [facility]    Orders Placed This Encounter   Procedures   GC/Chlamydia Probe Amp   Urine Culture   POC Urinalysis Dipstick OB    Myna Hidalgo, DO Attending Obstetrician & Gynecologist, Faculty Practice Center for [employer], [facility]

## 2021-11-29 LAB — GC/CHLAMYDIA PROBE AMP
Chlamydia trachomatis, NAA: NEGATIVE
Neisseria Gonorrhoeae by PCR: NEGATIVE

## 2021-11-29 LAB — URINE CULTURE

## 2021-12-01 ENCOUNTER — Other Ambulatory Visit: Payer: Medicaid Other

## 2021-12-04 ENCOUNTER — Encounter: Payer: Medicaid Other | Admitting: Advanced Practice Midwife

## 2021-12-04 ENCOUNTER — Other Ambulatory Visit: Payer: Medicaid Other

## 2021-12-04 ENCOUNTER — Other Ambulatory Visit: Payer: Self-pay | Admitting: Obstetrics & Gynecology

## 2021-12-04 DIAGNOSIS — O365921 Maternal care for other known or suspected poor fetal growth, second trimester, fetus 1: Secondary | ICD-10-CM

## 2021-12-05 ENCOUNTER — Ambulatory Visit (INDEPENDENT_AMBULATORY_CARE_PROVIDER_SITE_OTHER): Payer: Medicaid Other

## 2021-12-05 ENCOUNTER — Ambulatory Visit (INDEPENDENT_AMBULATORY_CARE_PROVIDER_SITE_OTHER): Payer: Medicaid Other | Admitting: Obstetrics & Gynecology

## 2021-12-05 ENCOUNTER — Encounter: Payer: Self-pay | Admitting: Obstetrics & Gynecology

## 2021-12-05 VITALS — BP 123/81 | HR 85 | Wt 151.0 lb

## 2021-12-05 DIAGNOSIS — Z3A27 27 weeks gestation of pregnancy: Secondary | ICD-10-CM

## 2021-12-05 DIAGNOSIS — R8271 Bacteriuria: Secondary | ICD-10-CM

## 2021-12-05 DIAGNOSIS — O365921 Maternal care for other known or suspected poor fetal growth, second trimester, fetus 1: Secondary | ICD-10-CM

## 2021-12-05 DIAGNOSIS — Z3402 Encounter for supervision of normal first pregnancy, second trimester: Secondary | ICD-10-CM

## 2021-12-05 DIAGNOSIS — A749 Chlamydial infection, unspecified: Secondary | ICD-10-CM

## 2021-12-05 DIAGNOSIS — O365990 Maternal care for other known or suspected poor fetal growth, unspecified trimester, not applicable or unspecified: Secondary | ICD-10-CM

## 2021-12-05 DIAGNOSIS — O0972 Supervision of high risk pregnancy due to social problems, second trimester: Secondary | ICD-10-CM

## 2021-12-05 NOTE — Progress Notes (Signed)
HIGH-RISK PREGNANCY VISIT Patient name: Monica Kramer MRN [id number]  Date of birth: Mar 29, 2001 Chief Complaint:   Routine Prenatal Visit (U/s)  History of Present Illness:   Monica Kramer is a 21 y.o. G1P0000 female at [redacted]w[redacted]d with an Estimated Date of Delivery: 03/06/22 being seen today for ongoing management of a high-risk pregnancy complicated by elevated dopplers 97% and fetal growth restriction 0.25%.    Today she reports no complaints. Contractions: Not present. Vag. Bleeding: None.  Movement: Present. denies leaking of fluid.      09/20/2021    2:07 PM 09/07/2020   11:19 AM 11/13/2017    1:16 PM  Depression screen PHQ 2/9  Decreased Interest 2 1 0  Down, Depressed, Hopeless 0 1 0  PHQ - 2 Score 2 2 0  Altered sleeping 0 1 0  Tired, decreased energy 1 0 0  Change in appetite 1 0 0  Feeling bad or failure about yourself  0 0 0  Trouble concentrating 0 0 0  Moving slowly or fidgety/restless 0 0 0  Suicidal thoughts 0 0 0  PHQ-9 Score 4 3 0  Difficult doing work/chores   Not difficult at all        09/20/2021    2:07 PM 09/07/2020   11:19 AM  GAD 7 : Generalized Anxiety Score  Nervous, Anxious, on Edge 0 0  Control/stop worrying 0 2  Worry too much - different things 0 2  Trouble relaxing 0 1  Restless 0 0  Easily annoyed or irritable 2 2  Afraid - awful might happen 0 0  Total GAD 7 Score 2 7     Review of Systems:   Pertinent items are noted in HPI Denies abnormal vaginal discharge w/ itching/odor/irritation, headaches, visual changes, shortness of breath, chest pain, abdominal pain, severe nausea/vomiting, or problems with urination or bowel movements unless otherwise stated above. Pertinent History Reviewed:  Reviewed past medical,surgical, social, obstetrical and family history.  Reviewed problem list, medications and allergies. Physical Assessment:   Vitals:   12/05/21 1517  BP: 123/81  Pulse: 85  Weight: 151 lb (68.5 kg)  Body mass index is  26.75 kg/m.           Physical Examination:   General appearance: alert, well appearing, and in no distress  Mental status: alert, oriented to person, place, and time  Skin: warm & dry   Extremities: Edema: None    Cardiovascular: normal heart rate noted  Respiratory: normal respiratory effort, no distress  Abdomen: gravid, soft, non-tender  Pelvic: Cervical exam deferred         Fetal Status:     Movement: Present    Fetal Surveillance Testing today: Dopplers 97%   Chaperone: N/A    No results found for this or any previous visit (from the past 24 hour(s)).  Assessment & Plan:  High-risk pregnancy: G1P0000 at 102w0d with an Estimated Date of Delivery: 03/06/22      ICD-10-CM   1. Supervision of high risk pregnancy due to social problems in second trimester  O09.72     2. Fetal growth restriction antepartum, severe, early, with elevated Dopplers  O36.5990         Meds: No orders of the defined types were placed in this encounter.   Orders: No orders of the defined types were placed in this encounter.    Labs/procedures today: U/S  Treatment Plan:  begin twice weekly surveillance with early severe FGR and elevated Doppler  ratios   Follow-up: No follow-ups on file.   Future Appointments  Date Time Provider Department Center  12/07/2021  9:10 AM [facility]  12/07/2021 10:50 AM [facility] NURSE [facility]  12/11/2021  3:00 PM [facility] [place] [facility] None  12/11/2021  3:50 PM Cheral Marker, CNM [facility]  12/14/2021  3:30 PM [facility] NURSE [facility]  12/18/2021  2:15 PM [facility] [place] [facility] None  12/18/2021  3:10 PM Myna Hidalgo, DO [facility]  12/21/2021  3:10 PM [facility] NURSE [facility]  12/26/2021  2:15 PM [facility] [place] [facility] None  12/26/2021  4:10 PM Lazaro Arms, MD [facility]  12/28/2021  3:30 PM [facility] NURSE [facility]  01/01/2022  1:30 PM [facility] [place] [facility] None  01/01/2022   2:30 PM Myna Hidalgo, DO [facility]  01/04/2022  3:30 PM [facility] NURSE [facility]  01/08/2022  2:15 PM [facility] [place] [facility] None  01/08/2022  3:10 PM Cheral Marker, CNM [facility]  01/11/2022  3:10 PM [facility] NURSE [facility]  01/15/2022  1:30 PM [facility] [place] [facility] None  01/15/2022  2:30 PM Lazaro Arms, MD [facility]  01/18/2022  3:30 PM [facility] NURSE [facility]  01/22/2022  2:15 PM [facility] [place] [facility] None  01/22/2022  3:10 PM Lazaro Arms, MD [facility]  01/25/2022  3:30 PM [facility] NURSE [facility]  01/29/2022  1:30 PM [facility] [place] [facility] None  01/29/2022  2:30 PM Myna Hidalgo, DO [facility]  02/01/2022  3:10 PM [facility] NURSE [facility]  02/06/2022  1:30 PM [facility] [place] [facility] None  02/06/2022  2:30 PM Lazaro Arms, MD [facility]  02/08/2022  9:50 AM [facility] NURSE [facility]  02/12/2022  1:30 PM [facility] [place] [facility] None  02/12/2022  2:30 PM Lazaro Arms, MD [facility]  02/15/2022  3:30 PM [facility] NURSE [facility]  02/19/2022  1:30 PM [facility] [place] [facility] None  02/19/2022  2:30 PM Myna Hidalgo, DO [facility]  02/22/2022  3:30 PM [facility] NURSE [facility]  02/26/2022  1:30 PM [facility] [place] [facility] None  02/26/2022  2:30 PM Lazaro Arms, MD [facility]  03/01/2022  3:30 PM [facility] NURSE [facility]  03/05/2022  1:30 PM [facility] [place] [facility] None  03/05/2022  2:30 PM Treysean Petruzzi, Amaryllis Dyke, MD [facility]    No orders of the defined types were placed in this encounter.  Lazaro Arms  Attending Physician for the [facility] 12/05/2021 3:44 PM

## 2021-12-05 NOTE — Progress Notes (Signed)
[place] 27 wks,cephalic,FHR 150 bpm,cx 3.5 cm,posterior placenta gr 0,SVP of fluid 4.8 cm,elevated UAD with EDF, RI .81,.84,.82=97%

## 2021-12-07 ENCOUNTER — Ambulatory Visit (INDEPENDENT_AMBULATORY_CARE_PROVIDER_SITE_OTHER): Payer: Medicaid Other | Admitting: *Deleted

## 2021-12-07 ENCOUNTER — Other Ambulatory Visit: Payer: Medicaid Other

## 2021-12-07 DIAGNOSIS — O365990 Maternal care for other known or suspected poor fetal growth, unspecified trimester, not applicable or unspecified: Secondary | ICD-10-CM | POA: Insufficient documentation

## 2021-12-07 DIAGNOSIS — O0992 Supervision of high risk pregnancy, unspecified, second trimester: Secondary | ICD-10-CM

## 2021-12-07 NOTE — Progress Notes (Addendum)
   NURSE VISIT- NST  SUBJECTIVE:  Monica Kramer is a 21 y.o. G79P0000 female at [redacted]w[redacted]d, here for a NST for pregnancy complicated by FGR.  She reports active fetal movement, contractions: none, vaginal bleeding: none, membranes: intact.   OBJECTIVE:  LMP 06/07/2021 (Approximate)   Appears well, no apparent distress  No results found for this or any previous visit (from the past 24 hour(s)).  NST: FHR baseline 145 bpm, Variability: moderate, Accelerations:not present, Decelerations:  Absent= Cat 1/non-reactive but appropriate for gestational age Toco: none   ASSESSMENT: G1P0000 at [redacted]w[redacted]d with FGR NST non-reactive but appropriate for gestational age  PLAN: EFM strip reviewed by Cathie Beams, CNM   Recommendations: keep next appointment as scheduled    Jobe Marker  12/07/2021 11:23 AM  Chart reviewed for nurse visit. Agree with plan of care.  Jacklyn Shell, CNM 12/07/2021 1:40 PM

## 2021-12-08 ENCOUNTER — Other Ambulatory Visit: Payer: Self-pay | Admitting: Obstetrics & Gynecology

## 2021-12-08 ENCOUNTER — Other Ambulatory Visit: Payer: Medicaid Other

## 2021-12-08 DIAGNOSIS — O365921 Maternal care for other known or suspected poor fetal growth, second trimester, fetus 1: Secondary | ICD-10-CM

## 2021-12-08 LAB — RUBELLA SCREEN: Rubella Antibodies, IGG: 1.31 index (ref >0.99)

## 2021-12-08 LAB — RPR: RPR Ser Ql: NONREACTIVE

## 2021-12-08 LAB — CMV ANTIBODY, IGG (EIA): CMV Ab - IgG: 4.8 U/mL — ABNORMAL HIGH (ref 0.00–0.59)

## 2021-12-08 LAB — HSV 2 ANTIBODY, IGG: HSV 2 IgG, Type Spec: <0.91 index (ref 0.00–0.90)

## 2021-12-08 LAB — HSV 1 ANTIBODY, IGG: HSV 1 Glycoprotein G Ab, IgG: 32.4 index — ABNORMAL HIGH (ref 0.00–0.90)

## 2021-12-08 LAB — TOXOPLASMA GONDII ANTIBODY, IGG: Toxoplasma IgG Ratio: <3 IU/mL (ref 0.0–7.1)

## 2021-12-11 ENCOUNTER — Ambulatory Visit (INDEPENDENT_AMBULATORY_CARE_PROVIDER_SITE_OTHER): Payer: Medicaid Other

## 2021-12-11 ENCOUNTER — Other Ambulatory Visit: Payer: Self-pay | Admitting: Obstetrics & Gynecology

## 2021-12-11 ENCOUNTER — Encounter: Payer: Self-pay | Admitting: Women's Health

## 2021-12-11 ENCOUNTER — Ambulatory Visit (INDEPENDENT_AMBULATORY_CARE_PROVIDER_SITE_OTHER): Payer: Medicaid Other | Admitting: Women's Health

## 2021-12-11 VITALS — BP 117/70 | HR 81 | Wt 152.8 lb

## 2021-12-11 DIAGNOSIS — O365921 Maternal care for other known or suspected poor fetal growth, second trimester, fetus 1: Secondary | ICD-10-CM | POA: Diagnosis not present

## 2021-12-11 DIAGNOSIS — A749 Chlamydial infection, unspecified: Secondary | ICD-10-CM

## 2021-12-11 DIAGNOSIS — Z3A27 27 weeks gestation of pregnancy: Secondary | ICD-10-CM

## 2021-12-11 DIAGNOSIS — R8271 Bacteriuria: Secondary | ICD-10-CM

## 2021-12-11 DIAGNOSIS — O0992 Supervision of high risk pregnancy, unspecified, second trimester: Secondary | ICD-10-CM

## 2021-12-11 DIAGNOSIS — O365990 Maternal care for other known or suspected poor fetal growth, unspecified trimester, not applicable or unspecified: Secondary | ICD-10-CM

## 2021-12-11 DIAGNOSIS — O98812 Other maternal infectious and parasitic diseases complicating pregnancy, second trimester: Secondary | ICD-10-CM

## 2021-12-11 NOTE — Patient Instructions (Addendum)
Lenetta Quaker, thank you for choosing our office today! We appreciate the opportunity to meet your healthcare needs. You may receive a short survey by mail, e-mail, or through [employer]. If you are happy with your care we would appreciate if you could take just a few minutes to complete the survey questions. We read all of your comments and take your feedback very seriously. Thank you again for choosing our office.  Center for [employer] Team at [facility]  [facility] ([address] [place], [place] [address]) Entrance C, located off of [address] Free 24/7 valet parking   You will have your sugar test next visit.  Please do not eat or drink anything after midnight the night before you come, not even water.  You will be here for at least two hours.  Please make an appointment online for the bloodwork at [email or web address] for 8:30am (or as close to this as possible). Make sure you select the [facility] service center. The day of the appointment, check in with our office first, then you will go to [facility] to start the sugar test.    CLASSES: Go to [email or web address] to register for classes (childbirth, breastfeeding, waterbirth, infant CPR, daddy bootcamp, etc.)  Call the office [phone]) or go to [facility] if: You begin to have strong, frequent contractions Your water breaks.  Sometimes it is a big gush of fluid, sometimes it is just a trickle that keeps getting your panties wet or running down your legs You have vaginal bleeding.  It is normal to have a small amount of spotting if your cervix was checked.  You don't feel your baby moving like normal.  If you don't, get you something to eat and drink and lay down and focus on feeling your baby move.   If your baby is still not moving like normal, you should call the office or go to [facility].  Call the office [phone]) or go to [facility] hospital for these signs of pre-eclampsia: Severe headache that does not  go away with Tylenol Visual changes- seeing spots, double, blurred vision Pain under your right breast or upper abdomen that does not go away with Tums or heartburn medicine Nausea and/or vomiting Severe swelling in your hands, feet, and face   Tdap Vaccine It is recommended that you get the Tdap vaccine during the third trimester of EACH pregnancy to help protect your baby from getting pertussis (whooping cough) 27-36 weeks is the BEST time to do this so that you can pass the protection on to your baby. During pregnancy is better than after pregnancy, but if you are unable to get it during pregnancy it will be offered at the hospital.  You can get this vaccine with [place], at the health department, your family doctor, or some local pharmacies Everyone who will be around your baby should also be up-to-date on their vaccines before the baby comes. Adults (who are not pregnant) only need 1 dose of Tdap during adulthood.   [facility] Pediatricians/Family Doctors [facility]): [address] Colette Ribas, [phone]           [facility]: [address], [phone]                [facility]): [address], [phone] (call to ask if accepting patients) [facility] Department: [address], [place], [phone]    [facility] Pediatricians/Family [facility] (  [facility]): [address], [phone] [facility]: [address] [place], [phone] [facility] [place]: [address], [phone]  [facility] Doctors  [facility]): [phone] [facility]: [address] [phone]   [facility] Doctors [facility]: [address] [phone]  [facility] Doctors  [facility]: [phone], [phone]  Home Blood Pressure Monitoring for Patients   Your  provider has recommended that you check your blood pressure (BP) at least once a week at home. If you do not have a blood pressure cuff at home, one will be provided for you. Contact your provider if you have not received your monitor within 1 week.   Helpful Tips for Accurate Home Blood Pressure Checks  Don't smoke, exercise, or drink caffeine 30 minutes before checking your BP Use the restroom before checking your BP (a full bladder can raise your pressure) Relax in a comfortable upright chair Feet on the ground Left arm resting comfortably on a flat surface at the level of your heart Legs uncrossed Back supported Sit quietly and don't talk Place the cuff on your bare arm Adjust snuggly, so that only two fingertips can fit between your skin and the top of the cuff Check 2 readings separated by at least one minute Keep a log of your BP readings For a visual, please reference this diagram: http://ccnc.care/bpdiagram  Provider Name: [facility]     Phone: [phone]  Zone 1: ALL CLEAR  Continue to monitor your symptoms:  BP reading is less than 140 (top number) or less than 90 (bottom number)  No right upper stomach pain No headaches or seeing spots No feeling nauseated or throwing up No swelling in face and hands  Zone 2: CAUTION Call your doctor's office for any of the following:  BP reading is greater than 140 (top number) or greater than 90 (bottom number)  Stomach pain under your ribs in the middle or right side Headaches or seeing spots Feeling nauseated or throwing up Swelling in face and hands  Zone 3: EMERGENCY  Seek immediate medical care if you have any of the following:  BP reading is greater than160 (top number) or greater than 110 (bottom number) Severe headaches not improving with Tylenol Serious difficulty catching your breath Any worsening symptoms from Zone 2   Third Trimester of Pregnancy The third trimester is from week 29 through week 42, months  7 through 9. The third trimester is a time when the fetus is growing rapidly. At the end of the ninth month, the fetus is about 20 inches in length and weighs 6-10 pounds.  BODY CHANGES Your body goes through many changes during pregnancy. The changes vary from woman to woman.  Your weight will continue to increase. You can expect to gain 25-35 pounds (11-16 kg) by the end of the pregnancy. You may begin to get stretch marks on your hips, abdomen, and breasts. You may urinate more often because the fetus is moving lower into your pelvis and pressing on your bladder. You may develop or continue to have heartburn as a result of your pregnancy. You may develop constipation because certain hormones are causing the muscles that push waste through your intestines to slow down. You may develop hemorrhoids or swollen, bulging veins (varicose veins). You may have pelvic pain because of the weight gain and pregnancy hormones relaxing your joints between the bones in  your pelvis. Backaches may result from overexertion of the muscles supporting your posture. You may have changes in your hair. These can include thickening of your hair, rapid growth, and changes in texture. Some women also have hair loss during or after pregnancy, or hair that feels dry or thin. Your hair will most likely return to normal after your baby is born. Your breasts will continue to grow and be tender. A yellow discharge may leak from your breasts called colostrum. Your belly button may stick out. You may feel short of breath because of your expanding uterus. You may notice the fetus "dropping," or moving lower in your abdomen. You may have a bloody mucus discharge. This usually occurs a few days to a week before labor begins. Your cervix becomes thin and soft (effaced) near your due date. WHAT TO EXPECT AT YOUR PRENATAL EXAMS  You will have prenatal exams every 2 weeks until week 36. Then, you will have weekly prenatal exams. During a  routine prenatal visit: You will be weighed to make sure you and the fetus are growing normally. Your blood pressure is taken. Your abdomen will be measured to track your baby's growth. The fetal heartbeat will be listened to. Any test results from the previous visit will be discussed. You may have a cervical check near your due date to see if you have effaced. At around 36 weeks, your caregiver will check your cervix. At the same time, your caregiver will also perform a test on the secretions of the vaginal tissue. This test is to determine if a type of bacteria, Group B streptococcus, is present. Your caregiver will explain this further. Your caregiver may ask you: What your birth plan is. How you are feeling. If you are feeling the baby move. If you have had any abnormal symptoms, such as leaking fluid, bleeding, severe headaches, or abdominal cramping. If you have any questions. Other tests or screenings that may be performed during your third trimester include: Blood tests that check for low iron levels (anemia). Fetal testing to check the health, activity level, and growth of the fetus. Testing is done if you have certain medical conditions or if there are problems during the pregnancy. FALSE LABOR You may feel small, irregular contractions that eventually go away. These are called Braxton Hicks contractions, or false labor. Contractions may last for hours, days, or even weeks before true labor sets in. If contractions come at regular intervals, intensify, or become painful, it is best to be seen by your caregiver.  SIGNS OF LABOR  Menstrual-like cramps. Contractions that are 5 minutes apart or less. Contractions that start on the top of the uterus and spread down to the lower abdomen and back. A sense of increased pelvic pressure or back pain. A watery or bloody mucus discharge that comes from the vagina. If you have any of these signs before the 37th week of pregnancy, call your  caregiver right away. You need to go to the hospital to get checked immediately. HOME CARE INSTRUCTIONS  Avoid all smoking, herbs, alcohol, and unprescribed drugs. These chemicals affect the formation and growth of the baby. Follow your caregiver's instructions regarding medicine use. There are medicines that are either safe or unsafe to take during pregnancy. Exercise only as directed by your caregiver. Experiencing uterine cramps is a good sign to stop exercising. Continue to eat regular, healthy meals. Wear a good support bra for breast tenderness. Do not use hot tubs, steam rooms, or saunas. Wear your  seat belt at all times when driving. Avoid raw meat, uncooked cheese, cat litter boxes, and soil used by cats. These carry germs that can cause birth defects in the baby. Take your prenatal vitamins. Try taking a stool softener (if your caregiver approves) if you develop constipation. Eat more high-fiber foods, such as fresh vegetables or fruit and whole grains. Drink plenty of fluids to keep your urine clear or pale yellow. Take warm sitz baths to soothe any pain or discomfort caused by hemorrhoids. Use hemorrhoid cream if your caregiver approves. If you develop varicose veins, wear support hose. Elevate your feet for 15 minutes, 3-4 times a day. Limit salt in your diet. Avoid heavy lifting, wear low heal shoes, and practice good posture. Rest a lot with your legs elevated if you have leg cramps or low back pain. Visit your dentist if you have not gone during your pregnancy. Use a soft toothbrush to brush your teeth and be gentle when you floss. A sexual relationship may be continued unless your caregiver directs you otherwise. Do not travel far distances unless it is absolutely necessary and only with the approval of your caregiver. Take prenatal classes to understand, practice, and ask questions about the labor and delivery. Make a trial run to the hospital. Pack your hospital bag. Prepare  the baby's nursery. Continue to go to all your prenatal visits as directed by your caregiver. SEEK MEDICAL CARE IF: You are unsure if you are in labor or if your water has broken. You have dizziness. You have mild pelvic cramps, pelvic pressure, or nagging pain in your abdominal area. You have persistent nausea, vomiting, or diarrhea. You have a bad smelling vaginal discharge. You have pain with urination. SEEK IMMEDIATE MEDICAL CARE IF:  You have a fever. You are leaking fluid from your vagina. You have spotting or bleeding from your vagina. You have severe abdominal cramping or pain. You have rapid weight loss or gain. You have shortness of breath with chest pain. You notice sudden or extreme swelling of your face, hands, ankles, feet, or legs. You have not felt your baby move in over an hour. You have severe headaches that do not go away with medicine. You have vision changes. Document Released: 04/03/2001 Document Revised: 04/14/2013 Document Reviewed: 06/10/2012 [facility] Patient Information 2015 [place], [place]. This information is not intended to replace advice given to you by your health care provider. Make sure you discuss any questions you have with your health care provider.

## 2021-12-11 NOTE — Progress Notes (Signed)
HIGH-RISK PREGNANCY VISIT Patient name: Monica Kramer MRN [id number]  Date of birth: 2001/01/01 Chief Complaint:   Routine Prenatal Visit  History of Present Illness:   Monica Kramer is a 21 y.o. G76P0000 female at [redacted]w[redacted]d with an Estimated Date of Delivery: 03/06/22 being seen today for ongoing management of a high-risk pregnancy complicated by elevated dopplers 92% and severe fetal growth restriction 0.25%.    Today she reports no complaints. States she had tried to get pn2 done 3x and hasn't been able to.  Contractions: Not present. Vag. Bleeding: None.  Movement: Present. denies leaking of fluid.      09/20/2021    2:07 PM 09/07/2020   11:19 AM 11/13/2017    1:16 PM  Depression screen PHQ 2/9  Decreased Interest 2 1 0  Down, Depressed, Hopeless 0 1 0  PHQ - 2 Score 2 2 0  Altered sleeping 0 1 0  Tired, decreased energy 1 0 0  Change in appetite 1 0 0  Feeling bad or failure about yourself  0 0 0  Trouble concentrating 0 0 0  Moving slowly or fidgety/restless 0 0 0  Suicidal thoughts 0 0 0  PHQ-9 Score 4 3 0  Difficult doing work/chores   Not difficult at all        09/20/2021    2:07 PM 09/07/2020   11:19 AM  GAD 7 : Generalized Anxiety Score  Nervous, Anxious, on Edge 0 0  Control/stop worrying 0 2  Worry too much - different things 0 2  Trouble relaxing 0 1  Restless 0 0  Easily annoyed or irritable 2 2  Afraid - awful might happen 0 0  Total GAD 7 Score 2 7     Review of Systems:   Pertinent items are noted in HPI Denies abnormal vaginal discharge w/ itching/odor/irritation, headaches, visual changes, shortness of breath, chest pain, abdominal pain, severe nausea/vomiting, or problems with urination or bowel movements unless otherwise stated above. Pertinent History Reviewed:  Reviewed past medical,surgical, social, obstetrical and family history.  Reviewed problem list, medications and allergies. Physical Assessment:   Vitals:   12/11/21 1544  BP:  117/70  Pulse: 81  Weight: 152 lb 12.8 oz (69.3 kg)  Body mass index is 27.07 kg/m.           Physical Examination:   General appearance: alert, well appearing, and in no distress  Mental status: alert, oriented to person, place, and time  Skin: warm & dry   Extremities: Edema: None    Cardiovascular: normal heart rate noted  Respiratory: normal respiratory effort, no distress  Abdomen: gravid, soft, non-tender  Pelvic: Cervical exam deferred         Fetal Status: Fetal Heart Rate (bpm): 150 u/s   Movement: Present    Fetal Surveillance Testing today: [place] 27+6 wks,cephalic,BPP 8/8,FHR 150 bpm,posterior placenta gr 1,AFI 12 cm,LVEICF,elevated UAD with EDF,RI .76,.77,.80,.74=92%  Chaperone: N/A    No results found for this or any previous visit (from the past 24 hour(s)).  Assessment & Plan:  High-risk pregnancy: G1P0000 at [redacted]w[redacted]d with an Estimated Date of Delivery: 03/06/22   1) Severe FGR w/ elevated UAD, dx @ 20wks, last EFW 0.25% (24wks), UAD today 94% w/ good EDF & BPP 8/8, continue antenatal testing  Meds: No orders of the defined types were placed in this encounter.  Labs/procedures today: U/S, wants tdap next visit  Treatment Plan: Weekly UAD/BPP, weekly NST, EFW q3wks, IOL 37wks or prn  Reviewed: Preterm labor symptoms and general obstetric precautions including but not limited to vaginal bleeding, contractions, leaking of fluid and fetal movement were reviewed in detail with the patient.  All questions were answered. Does have home bp cuff. Office bp cuff given: not applicable. Check bp weekly, let us know if consistently >140 and/or >90.  Follow-up: Return for As scheduled, needs pn2 asap.   Future Appointments  Date Time Provider Department Center  12/12/2021  9:30 AM [facility]  12/14/2021  3:30 PM [facility] NURSE [facility]  12/18/2021  2:15 PM [facility] [place] [facility] None  12/18/2021  3:10 PM Myna Hidalgo, DO [facility]  12/21/2021   3:10 PM [facility] NURSE [facility]  12/26/2021  2:15 PM [facility] [place] [facility] None  12/26/2021  4:10 PM Lazaro Arms, MD [facility]  12/28/2021  3:30 PM [facility] N[facility]SE [facility]  01/01/2022  1:30 PM [facility] [place] [facility] None  01/01/2022  2:30 PM Myna Hidalgo, DO [facility]  01/04/2022  3:30 PM [facility] N[facility]SE [facility]  01/08/2022  2:15 PM [facility] [place] [facility] None  01/08/2022  3:10 PM Cheral Marker, CNM [facility]  01/11/2022  3:10 PM [facility] N[facility]  01/15/2022  1:30 PM [facility] [place] [facility] None  01/15/2022  2:30 PM Lazaro Arms, MD [facility]  01/18/2022  3:30 PM [facility] N[facility]  01/22/2022  2:15 PM [facility] [place] [facility] None  01/22/2022  3:10 PM Lazaro Arms, MD [facility]  01/25/2022  3:30 PM [facility] N[facility]SE [facility]  01/29/2022  1:30 PM [facility] [place] [facility] None  01/29/2022  2:30 PM Myna Hidalgo, DO [facility]  02/01/2022  3:10 PM [facility] N[facility]  02/06/2022  1:30 PM [facility] [place] [facility] None  02/06/2022  2:30 PM Lazaro Arms, MD [facility]  02/08/2022  9:50 AM [facility] N[facility]  02/12/2022  1:30 PM [facility] [place] [facility] None  02/12/2022  2:30 PM Lazaro Arms, MD [facility]  02/15/2022  3:30 PM [facility]  02/19/2022  1:30 PM [facility] [place] [facility] None  02/19/2022  2:30 PM Myna Hidalgo, DO [facility]  02/22/2022  3:30 PM [facility]  02/26/2022  1:30 PM [facility] [place] [facility] None  02/26/2022  2:30 PM Lazaro Arms, MD [facility]  03/01/2022  3:30 PM [facility]  03/05/2022  1:30 PM [facility] [place] [facility] None  03/05/2022  2:30 PM Eure, Amaryllis Dyke, MD [facility]    No orders of the defined types were placed in this encounter.  Cheral Marker CNM, [facility] 12/11/2021 4:40 PM

## 2021-12-11 NOTE — Progress Notes (Signed)
[place] 27+6 wks,cephalic,BPP 8/8,FHR 150 bpm,posterior placenta gr 1,AFI 12 cm,LVEICF,elevated UAD with EDF,RI .76,.77,.80,.74=92%

## 2021-12-12 ENCOUNTER — Other Ambulatory Visit: Payer: Medicaid Other

## 2021-12-14 ENCOUNTER — Other Ambulatory Visit: Payer: Medicaid Other

## 2021-12-14 ENCOUNTER — Other Ambulatory Visit: Payer: Self-pay | Admitting: Women's Health

## 2021-12-14 DIAGNOSIS — O365920 Maternal care for other known or suspected poor fetal growth, second trimester, not applicable or unspecified: Secondary | ICD-10-CM

## 2021-12-18 ENCOUNTER — Encounter: Payer: Self-pay | Admitting: Obstetrics & Gynecology

## 2021-12-18 ENCOUNTER — Encounter: Payer: Medicaid Other | Admitting: Obstetrics & Gynecology

## 2021-12-18 ENCOUNTER — Ambulatory Visit (INDEPENDENT_AMBULATORY_CARE_PROVIDER_SITE_OTHER): Payer: Medicaid Other | Admitting: Obstetrics & Gynecology

## 2021-12-18 ENCOUNTER — Ambulatory Visit (INDEPENDENT_AMBULATORY_CARE_PROVIDER_SITE_OTHER): Payer: Medicaid Other

## 2021-12-18 VITALS — BP 121/71 | HR 77 | Wt 150.6 lb

## 2021-12-18 DIAGNOSIS — A749 Chlamydial infection, unspecified: Secondary | ICD-10-CM

## 2021-12-18 DIAGNOSIS — O0992 Supervision of high risk pregnancy, unspecified, second trimester: Secondary | ICD-10-CM

## 2021-12-18 DIAGNOSIS — Z3402 Encounter for supervision of normal first pregnancy, second trimester: Secondary | ICD-10-CM | POA: Diagnosis not present

## 2021-12-18 DIAGNOSIS — Z3A28 28 weeks gestation of pregnancy: Secondary | ICD-10-CM | POA: Diagnosis not present

## 2021-12-18 DIAGNOSIS — O365920 Maternal care for other known or suspected poor fetal growth, second trimester, not applicable or unspecified: Secondary | ICD-10-CM | POA: Diagnosis not present

## 2021-12-18 DIAGNOSIS — O365990 Maternal care for other known or suspected poor fetal growth, unspecified trimester, not applicable or unspecified: Secondary | ICD-10-CM

## 2021-12-18 DIAGNOSIS — R8271 Bacteriuria: Secondary | ICD-10-CM

## 2021-12-18 DIAGNOSIS — Z23 Encounter for immunization: Secondary | ICD-10-CM | POA: Diagnosis not present

## 2021-12-18 DIAGNOSIS — Z131 Encounter for screening for diabetes mellitus: Secondary | ICD-10-CM

## 2021-12-18 DIAGNOSIS — Z3A26 26 weeks gestation of pregnancy: Secondary | ICD-10-CM | POA: Diagnosis not present

## 2021-12-18 NOTE — Progress Notes (Signed)
HIGH-RISK PREGNANCY VISIT Patient name: Monica Kramer MRN [id number]  Date of birth: 2000/07/30 Chief Complaint:   Routine Prenatal Visit  History of Present Illness:   Monica Kramer is a 21 y.o. G35P0000 female at [redacted]w[redacted]d with an Estimated Date of Delivery: 03/06/22 being seen today for ongoing management of a high-risk pregnancy complicated by:  -severe FGR.   -GBS bacteruria Today she reports no complaints.   Contractions: Not present. Vag. Bleeding: None.  Movement: Present. denies leaking of fluid.      09/20/2021    2:07 PM 09/07/2020   11:19 AM 11/13/2017    1:16 PM  Depression screen PHQ 2/9  Decreased Interest 2 1 0  Down, Depressed, Hopeless 0 1 0  PHQ - 2 Score 2 2 0  Altered sleeping 0 1 0  Tired, decreased energy 1 0 0  Change in appetite 1 0 0  Feeling bad or failure about yourself  0 0 0  Trouble concentrating 0 0 0  Moving slowly or fidgety/restless 0 0 0  Suicidal thoughts 0 0 0  PHQ-9 Score 4 3 0  Difficult doing work/chores   Not difficult at all     Current Outpatient Medications  Medication Instructions   Blood Pressure Monitor MISC For regular home bp monitoring during pregnancy   Prenatal MV & Min w/FA-DHA (PRENATAL GUMMIES PO) Oral     Review of Systems:   Pertinent items are noted in HPI Denies abnormal vaginal discharge w/ itching/odor/irritation, headaches, visual changes, shortness of breath, chest pain, abdominal pain, severe nausea/vomiting, or problems with urination or bowel movements unless otherwise stated above. Pertinent History Reviewed:  Reviewed past medical,surgical, social, obstetrical and family history.  Reviewed problem list, medications and allergies. Physical Assessment:   Vitals:   12/18/21 1510  BP: 121/71  Pulse: 77  Weight: 150 lb 9.6 oz (68.3 kg)  Body mass index is 26.68 kg/m.           Physical Examination:   General appearance: alert, well appearing, and in no distress  Mental status: normal mood,  behavior, speech, dress, motor activity, and thought processes  Skin: warm & dry   Extremities: Edema: None    Cardiovascular: normal heart rate noted  Respiratory: normal respiratory effort, no distress  Abdomen: gravid, soft, non-tender  Pelvic: Cervical exam deferred         Fetal Status:     Movement: Present    Fetal Surveillance Testing today: cephalic,posterior placenta gr 1,normal ovaries,FHR 144 bpm,elevated UAD with EDF,RI .76,.80,.79=95%,BPP 6/8,no breathing,EFW 829 g .17%,AC .03%   Chaperone: N/A    NST being performed due to severe FGR   Fetal Monitoring:  Baseline: 140 bpm, Variability: moderate, Accelerations: +10x10 accels, The accelerations are >15 bpm and more than 2 in 20 minutes, and Decelerations: Absent     Final diagnosis:  Appropriate for current gestational age   Assessment & Plan:  High-risk pregnancy: G1P0000 at [redacted]w[redacted]d with an Estimated Date of Delivery: 03/06/22   1) FGR -growth as above -continue twice weekly testing, BPP with NST 8/10 -will change to BPP and NST twice weekly scheduled -IOL pending dopplers, current EDF present and will continue plan as above  2) GBS bacteruria PCN in labor  Meds: No orders of the defined types were placed in this encounter.   Labs/procedures today: pt has declined glucola- plan for A1c and lab work today  Treatment Plan:  as outlined above  Reviewed: Preterm labor symptoms and general obstetric precautions  including but not limited to vaginal bleeding, contractions, leaking of fluid and fetal movement were reviewed in detail with the patient.  All questions were answered. Pt has home bp cuff. Check bp weekly, let us know if >140/90.   Follow-up: Return for twice weekly as scheduled.   Future Appointments  Date Time Provider Department Center  12/21/2021  3:10 PM [facility] NUR[facility]  12/26/2021  2:15 PM [facility] [place] [facility] None  12/26/2021  4:10 PM Lazaro Arms, MD [facility]   12/28/2021  3:30 PM [facility]  01/01/2022  1:30 PM [facility] [place] [facility] None  01/01/2022  2:30 PM Myna Hidalgo, DO [facility]  01/04/2022  3:30 PM [facility]  01/08/2022  2:15 PM [facility] [place] [facility] None  01/08/2022  3:10 PM Cheral Marker, CNM [facility]  01/11/2022  3:10 PM [facility]  01/15/2022  1:30 PM [facility] [place] [facility] None  01/15/2022  2:30 PM Lazaro Arms, MD [facility]  01/18/2022  3:30 PM [facility]  01/22/2022  2:15 PM [facility] [place] [facility] None  01/22/2022  3:10 PM Lazaro Arms, MD [facility]  01/25/2022  3:30 PM [facility]  01/29/2022  1:30 PM [facility] [place] [facility] None  01/29/2022  2:30 PM Myna Hidalgo, DO [facility]  02/01/2022  3:10 PM [facility] N[facility]  02/06/2022  1:30 PM [facility] [place] [facility] None  02/06/2022  2:30 PM Lazaro Arms, MD [facility]  02/08/2022  9:50 AM [facility]  02/12/2022  1:30 PM [facility] [place] [facility] None  02/12/2022  2:30 PM Lazaro Arms, MD [facility]  02/15/2022  3:30 PM [facility]  02/19/2022  1:30 PM [facility] [place] [facility] None  02/19/2022  2:30 PM Myna Hidalgo, DO [facility]  02/22/2022  3:30 PM [facility]  02/26/2022  1:30 PM [facility] [place] [facility] None  02/26/2022  2:30 PM Lazaro Arms, MD [facility]  03/01/2022  3:30 PM [facility]  03/05/2022  1:30 PM [facility] [place] [facility] None  03/05/2022  2:30 PM Cresenzo-Dishmon, Scarlette Calico, CNM [facility]    Orders Placed This Encounter  Procedures   Tdap vaccine greater than or equal to 7yo IM   HgB A1c    Myna Hidalgo, DO Attending Obstetrician & Gynecologist, [facility] [employer], [facility]

## 2021-12-18 NOTE — Progress Notes (Signed)
[place] 28+6 wks,cephalic,posterior placenta gr 1,normal ovaries,FHR 144 bpm,elevated UAD with EDF,RI .76,.80,.79=95%,BPP 6/8,no breathing,EFW 829 g .17%,AC .03%

## 2021-12-19 LAB — CBC
Hematocrit: 41.1 % (ref 34.0–46.6)
Hemoglobin: 13.8 g/dL (ref 11.1–15.9)
MCH: 30.1 pg (ref 26.6–33.0)
MCHC: 33.6 g/dL (ref 31.5–35.7)
MCV: 90 fL (ref 79–97)
Platelets: 275 10*3/uL (ref 150–450)
RBC: 4.59 x10E6/uL (ref 3.77–5.28)
RDW: 13.4 % (ref 11.7–15.4)
WBC: 7.5 10*3/uL (ref 3.4–10.8)

## 2021-12-19 LAB — HIV ANTIBODY (ROUTINE TESTING W REFLEX): HIV Screen 4th Generation wRfx: NONREACTIVE

## 2021-12-19 LAB — HEMOGLOBIN A1C
Est. average glucose Bld gHb Est-mCnc: 108 mg/dL
Hgb A1c MFr Bld: 5.4 % (ref 4.8–5.6)

## 2021-12-19 LAB — ANTIBODY SCREEN: Antibody Screen: NEGATIVE

## 2021-12-20 ENCOUNTER — Other Ambulatory Visit: Payer: Self-pay | Admitting: Obstetrics & Gynecology

## 2021-12-20 DIAGNOSIS — O365921 Maternal care for other known or suspected poor fetal growth, second trimester, fetus 1: Secondary | ICD-10-CM

## 2021-12-21 ENCOUNTER — Other Ambulatory Visit: Payer: Medicaid Other

## 2021-12-26 ENCOUNTER — Encounter: Payer: Self-pay | Admitting: Obstetrics and Gynecology

## 2021-12-26 ENCOUNTER — Ambulatory Visit (INDEPENDENT_AMBULATORY_CARE_PROVIDER_SITE_OTHER): Payer: Medicaid Other | Admitting: Obstetrics and Gynecology

## 2021-12-26 ENCOUNTER — Ambulatory Visit (INDEPENDENT_AMBULATORY_CARE_PROVIDER_SITE_OTHER): Payer: Medicaid Other

## 2021-12-26 VITALS — BP 125/72 | HR 85 | Wt 152.4 lb

## 2021-12-26 DIAGNOSIS — A749 Chlamydial infection, unspecified: Secondary | ICD-10-CM

## 2021-12-26 DIAGNOSIS — Z3A30 30 weeks gestation of pregnancy: Secondary | ICD-10-CM

## 2021-12-26 DIAGNOSIS — O0993 Supervision of high risk pregnancy, unspecified, third trimester: Secondary | ICD-10-CM

## 2021-12-26 DIAGNOSIS — R8271 Bacteriuria: Secondary | ICD-10-CM

## 2021-12-26 DIAGNOSIS — O365990 Maternal care for other known or suspected poor fetal growth, unspecified trimester, not applicable or unspecified: Secondary | ICD-10-CM

## 2021-12-26 DIAGNOSIS — O0992 Supervision of high risk pregnancy, unspecified, second trimester: Secondary | ICD-10-CM

## 2021-12-26 DIAGNOSIS — O365921 Maternal care for other known or suspected poor fetal growth, second trimester, fetus 1: Secondary | ICD-10-CM | POA: Diagnosis not present

## 2021-12-26 NOTE — Progress Notes (Signed)
Subjective:  Monica Kramer is a 21 y.o. G1P0000 at [redacted]w[redacted]d being seen today for ongoing prenatal care.  She is currently monitored for the following issues for this high-risk pregnancy and has Supervision of high-risk pregnancy; GBS bacteriuria; and Fetal growth restriction antepartum on their problem list.  Patient reports no complaints.  Contractions: Not present. Vag. Bleeding: None.  Movement: Present. Denies leaking of fluid.   The following portions of the patient's history were reviewed and updated as appropriate: allergies, current medications, past family history, past medical history, past social history, past surgical history and problem list. Problem list updated.  Objective:   Vitals:   12/26/21 1448  BP: 125/72  Pulse: 85  Weight: 152 lb 6.4 oz (69.1 kg)    Fetal Status:     Movement: Present     General:  Alert, oriented and cooperative. Patient is in no acute distress.  Skin: Skin is warm and dry. No rash noted.   Cardiovascular: Normal heart rate noted  Respiratory: Normal respiratory effort, no problems with respiration noted  Abdomen: Soft, gravid, appropriate for gestational age. Pain/Pressure: Absent     Pelvic:  Cervical exam deferred        Extremities: Normal range of motion.  Edema: None  Mental Status: Normal mood and affect. Normal behavior. Normal judgment and thought content.   Urinalysis:      Assessment and Plan:  Pregnancy: G1P0000 at [redacted]w[redacted]d  1. Supervision of high risk pregnancy in third trimester Stable  2. Fetal growth restriction antepartum Stable BPP 8/8  Doppler studies, stable, no change Continue with serial growth scans and antenatal testing IOL at 37 weeks or for maternal/fetal indications  3. GBS bacteriuria Tx while in labor  Preterm labor symptoms and general obstetric precautions including but not limited to vaginal bleeding, contractions, leaking of fluid and fetal movement were reviewed in detail with the patient. Please  refer to After Visit Summary for other counseling recommendations.  Return in about 2 weeks (around 01/09/2022) for OB visit, face to face, MD only.   Hermina Staggers, MD

## 2021-12-26 NOTE — Progress Notes (Signed)
[place] 30 wks,cephalic,BPP 8/8,BPP 8/8,FHR 126 bpm,elevated UAD with EDF,RI .73,.74,.79,.76=93%,posterior placenta gr 2,AFI 12 cm

## 2021-12-26 NOTE — Patient Instructions (Signed)

## 2021-12-28 ENCOUNTER — Ambulatory Visit (INDEPENDENT_AMBULATORY_CARE_PROVIDER_SITE_OTHER): Payer: Medicaid Other | Admitting: *Deleted

## 2021-12-28 VITALS — BP 122/83 | HR 93

## 2021-12-28 DIAGNOSIS — O365990 Maternal care for other known or suspected poor fetal growth, unspecified trimester, not applicable or unspecified: Secondary | ICD-10-CM

## 2021-12-28 DIAGNOSIS — O0993 Supervision of high risk pregnancy, unspecified, third trimester: Secondary | ICD-10-CM | POA: Diagnosis not present

## 2021-12-28 DIAGNOSIS — R8271 Bacteriuria: Secondary | ICD-10-CM

## 2021-12-28 NOTE — Progress Notes (Signed)
   NURSE VISIT- NST  SUBJECTIVE:  Monica Kramer is a 21 y.o. G57P0000 female at [redacted]w[redacted]d, here for a NST for pregnancy complicated by FGR.  She reports active fetal movement, contractions: none, vaginal bleeding: none, membranes: intact.   OBJECTIVE:  BP 122/83   Pulse 93   LMP 06/07/2021 (Approximate)   Appears well, no apparent distress  No results found for this or any previous visit (from the past 24 hour(s)).  NST: FHR baseline 135 bpm, Variability: moderate, Accelerations:present, Decelerations:  Absent= Cat 1/reactive Toco: none   ASSESSMENT: G1P0000 at [redacted]w[redacted]d with FGR NST reactive  PLAN: EFM strip reviewed by Cathie Beams, CNM   Recommendations: keep next appointment as scheduled    Jobe Marker  12/28/2021 4:34 PM

## 2021-12-29 ENCOUNTER — Other Ambulatory Visit: Payer: Self-pay | Admitting: Obstetrics & Gynecology

## 2021-12-29 DIAGNOSIS — O365921 Maternal care for other known or suspected poor fetal growth, second trimester, fetus 1: Secondary | ICD-10-CM

## 2022-01-01 ENCOUNTER — Encounter: Payer: Self-pay | Admitting: Obstetrics & Gynecology

## 2022-01-01 ENCOUNTER — Ambulatory Visit (INDEPENDENT_AMBULATORY_CARE_PROVIDER_SITE_OTHER): Payer: Medicaid Other | Admitting: Obstetrics & Gynecology

## 2022-01-01 ENCOUNTER — Ambulatory Visit (INDEPENDENT_AMBULATORY_CARE_PROVIDER_SITE_OTHER): Payer: Medicaid Other

## 2022-01-01 VITALS — BP 117/73 | HR 77 | Wt 153.8 lb

## 2022-01-01 DIAGNOSIS — O365990 Maternal care for other known or suspected poor fetal growth, unspecified trimester, not applicable or unspecified: Secondary | ICD-10-CM

## 2022-01-01 DIAGNOSIS — O365921 Maternal care for other known or suspected poor fetal growth, second trimester, fetus 1: Secondary | ICD-10-CM | POA: Diagnosis not present

## 2022-01-01 DIAGNOSIS — Z3A30 30 weeks gestation of pregnancy: Secondary | ICD-10-CM

## 2022-01-01 DIAGNOSIS — O0992 Supervision of high risk pregnancy, unspecified, second trimester: Secondary | ICD-10-CM

## 2022-01-01 DIAGNOSIS — O0993 Supervision of high risk pregnancy, unspecified, third trimester: Secondary | ICD-10-CM | POA: Diagnosis not present

## 2022-01-01 DIAGNOSIS — R8271 Bacteriuria: Secondary | ICD-10-CM

## 2022-01-01 NOTE — Progress Notes (Signed)
[place] 30+6 wks,cephalic,FHR 146 bpm,posterior placenta gr 3,RI .68,.63,.65,.69=66%,BPP 8/8,AFI 14 cm

## 2022-01-01 NOTE — Progress Notes (Signed)
HIGH-RISK PREGNANCY VISIT Patient name: Monica Kramer MRN [id number]  Date of birth: 2001/04/23 Chief Complaint:   Routine Prenatal Visit ([place] and NST)  History of Present Illness:   Monica Kramer is a 21 y.o. G7P0000 female at [redacted]w[redacted]d with an Estimated Date of Delivery: 03/06/22 being seen today for ongoing management of a high-risk pregnancy complicated by:  -Severe FGR -GBS bacteruria.    Today she reports no complaints.   Contractions: Not present. Vag. Bleeding: None.  Movement: Present. denies leaking of fluid.      09/20/2021    2:07 PM 09/07/2020   11:19 AM 11/13/2017    1:16 PM  Depression screen PHQ 2/9  Decreased Interest 2 1 0  Down, Depressed, Hopeless 0 1 0  PHQ - 2 Score 2 2 0  Altered sleeping 0 1 0  Tired, decreased energy 1 0 0  Change in appetite 1 0 0  Feeling bad or failure about yourself  0 0 0  Trouble concentrating 0 0 0  Moving slowly or fidgety/restless 0 0 0  Suicidal thoughts 0 0 0  PHQ-9 Score 4 3 0  Difficult doing work/chores   Not difficult at all     Current Outpatient Medications  Medication Instructions   Blood Pressure Monitor MISC For regular home bp monitoring during pregnancy   Prenatal MV & Min w/FA-DHA (PRENATAL GUMMIES PO) Oral     Review of Systems:   Pertinent items are noted in HPI Denies abnormal vaginal discharge w/ itching/odor/irritation, headaches, visual changes, shortness of breath, chest pain, abdominal pain, severe nausea/vomiting, or problems with urination or bowel movements unless otherwise stated above. Pertinent History Reviewed:  Reviewed past medical,surgical, social, obstetrical and family history.  Reviewed problem list, medications and allergies. Physical Assessment:   Vitals:   01/01/22 1420  BP: 117/73  Pulse: 77  Weight: 153 lb 12.8 oz (69.8 kg)  Body mass index is 27.24 kg/m.           Physical Examination:   General appearance: alert, well appearing, and in no distress  Mental status:  normal mood, behavior, speech, dress, motor activity, and thought processes  Skin: warm & dry   Extremities: Edema: None    Cardiovascular: normal heart rate noted  Respiratory: normal respiratory effort, no distress  Abdomen: gravid, soft, non-tender  Pelvic: Cervical exam deferred         Fetal Status:     Movement: Present    NST being performed due to FGR   Fetal Monitoring:  Baseline: 130 bpm, Variability: moderate, Accelerations: +10x10 accels x2 and Decelerations: Absent     Final diagnosis:  Appropriate for current gestational age   Fetal Surveillance Testing today:  [place] 30+6 wks,cephalic,FHR 146 bpm,posterior placenta gr 3,RI .68,.63,.65,.69=66%,BPP 8/8,AFI 14 cm  Chaperone: N/A    No results found for this or any previous visit (from the past 24 hour(s)).   Assessment & Plan:  High-risk pregnancy: G1P0000 at [redacted]w[redacted]d with an Estimated Date of Delivery: 03/06/22   1) FGR BPP today 8/8 with normal dopplers today -continue weekly dopplers -NST twice weekly @ 32wks -plan for IOL @ 37wks  2) GBS bacteruria -PCN in labor  Meds: No orders of the defined types were placed in this encounter.   Labs/procedures today: BPP/dopplers  Treatment Plan:  as outlined above  Reviewed: Preterm labor symptoms and general obstetric precautions including but not limited to vaginal bleeding, contractions, leaking of fluid and fetal movement were reviewed in detail with  the patient.  All questions were answered. Pt has home bp cuff. Check bp weekly, let us know if >140/90.   Follow-up: Return in about 2 weeks (around 01/15/2022).   Future Appointments  Date Time Provider Department Center  01/04/2022  3:30 PM [facility] NURSE [facility]  01/08/2022  2:15 PM [facility] [place] [facility] None  01/08/2022  3:10 PM Cheral Marker, CNM [facility]  01/11/2022  3:10 PM [facility] N[facility]  01/15/2022  1:30 PM [facility] [place] [facility] None  01/15/2022  2:30 PM Lazaro Arms, MD [facility]  01/18/2022  3:30 PM [facility]  01/22/2022  2:15 PM [facility] [place] [facility] None  01/22/2022  3:10 PM Lazaro Arms, MD [facility]  01/25/2022  3:30 PM [facility]  01/29/2022  1:30 PM [facility] [place] [facility] None  01/29/2022  2:30 PM Milas Hock, MD [facility]  02/01/2022  3:10 PM [facility] N[facility]  02/06/2022  1:30 PM [facility] [place] [facility] None  02/06/2022  2:30 PM Lazaro Arms, MD [facility]  02/08/2022  9:50 AM [facility] NURSE [facility]  02/12/2022  1:30 PM [facility] [place] [facility] None  02/12/2022  2:30 PM Lazaro Arms, MD [facility]  02/15/2022  3:30 PM [facility]  02/19/2022  1:30 PM [facility] [place] [facility] None  02/19/2022  2:30 PM Myna Hidalgo, DO [facility]  02/22/2022  3:30 PM [facility] N[facility]  02/26/2022  1:30 PM [facility] [place] [facility] None  02/26/2022  2:30 PM Lazaro Arms, MD [facility]  03/01/2022  3:30 PM [facility] N[facility]  03/05/2022  1:30 PM [facility] [place] [facility] None  03/05/2022  2:30 PM Cresenzo-Dishmon, Scarlette Calico, CNM [facility]    No orders of the defined types were placed in this encounter.   Myna Hidalgo, DO Attending Obstetrician & Gynecologist, [facility] for [employer], [facility]

## 2022-01-04 ENCOUNTER — Ambulatory Visit (INDEPENDENT_AMBULATORY_CARE_PROVIDER_SITE_OTHER): Payer: Medicaid Other | Admitting: *Deleted

## 2022-01-04 VITALS — BP 118/76 | HR 84 | Wt 152.5 lb

## 2022-01-04 DIAGNOSIS — O365990 Maternal care for other known or suspected poor fetal growth, unspecified trimester, not applicable or unspecified: Secondary | ICD-10-CM | POA: Diagnosis not present

## 2022-01-04 DIAGNOSIS — Z3A31 31 weeks gestation of pregnancy: Secondary | ICD-10-CM | POA: Diagnosis not present

## 2022-01-04 DIAGNOSIS — O0993 Supervision of high risk pregnancy, unspecified, third trimester: Secondary | ICD-10-CM | POA: Diagnosis not present

## 2022-01-04 DIAGNOSIS — R8271 Bacteriuria: Secondary | ICD-10-CM

## 2022-01-04 NOTE — Progress Notes (Signed)
   NURSE VISIT- NST  SUBJECTIVE:  Monica Kramer is a 21 y.o. G1P0000 female at [redacted]w[redacted]d, here for a NST for pregnancy complicated by FGR.  She reports active fetal movement, contractions: none, vaginal bleeding: none, membranes: intact.   OBJECTIVE:  BP 118/76   Pulse 84   Wt 152 lb 8 oz (69.2 kg)   LMP 06/07/2021 (Approximate)   BMI 27.01 kg/m   Appears well, no apparent distress  No results found for this or any previous visit (from the past 24 hour(s)).  NST: FHR baseline 145 bpm, Variability: moderate, Accelerations:present, Decelerations:  Absent= Cat 1/reactive Toco: UI   ASSESSMENT: G1P0000 at [redacted]w[redacted]d with FGR NST reactive  PLAN: EFM strip reviewed by Dr. Charlotta Newton   Recommendations: keep next appointment as scheduled    Jobe Marker  01/04/2022 4:29 PM

## 2022-01-05 ENCOUNTER — Other Ambulatory Visit: Payer: Self-pay | Admitting: Obstetrics & Gynecology

## 2022-01-05 DIAGNOSIS — O365931 Maternal care for other known or suspected poor fetal growth, third trimester, fetus 1: Secondary | ICD-10-CM

## 2022-01-08 ENCOUNTER — Ambulatory Visit (INDEPENDENT_AMBULATORY_CARE_PROVIDER_SITE_OTHER): Payer: Medicaid Other

## 2022-01-08 ENCOUNTER — Encounter: Payer: Self-pay | Admitting: Women's Health

## 2022-01-08 ENCOUNTER — Ambulatory Visit (INDEPENDENT_AMBULATORY_CARE_PROVIDER_SITE_OTHER): Payer: Medicaid Other | Admitting: Women's Health

## 2022-01-08 VITALS — BP 121/64 | HR 75 | Wt 152.0 lb

## 2022-01-08 DIAGNOSIS — O365931 Maternal care for other known or suspected poor fetal growth, third trimester, fetus 1: Secondary | ICD-10-CM

## 2022-01-08 DIAGNOSIS — O365990 Maternal care for other known or suspected poor fetal growth, unspecified trimester, not applicable or unspecified: Secondary | ICD-10-CM

## 2022-01-08 DIAGNOSIS — Z3A31 31 weeks gestation of pregnancy: Secondary | ICD-10-CM | POA: Diagnosis not present

## 2022-01-08 DIAGNOSIS — O0993 Supervision of high risk pregnancy, unspecified, third trimester: Secondary | ICD-10-CM

## 2022-01-08 DIAGNOSIS — R8271 Bacteriuria: Secondary | ICD-10-CM

## 2022-01-08 NOTE — Patient Instructions (Signed)
Monica Kramer, thank you for choosing our office today! We appreciate the opportunity to meet your healthcare needs. You may receive a short survey by mail, e-mail, or through [employer]. If you are happy with your care we would appreciate if you could take just a few minutes to complete the survey questions. We read all of your comments and take your feedback very seriously. Thank you again for choosing our office.  [facility] [employer] Team at [place] at [facility] ([place], [place] [address]) Entrance C, located off of [place] parking   CLASSES: Go to [email or web address] to register for classes (childbirth, breastfeeding, waterbirth, infant CPR, daddy bootcamp, etc.)  Call the office [phone]) or go to [facility] if: You begin to have strong, frequent contractions Your water breaks.  Sometimes it is a big gush of fluid, sometimes it is just a trickle that keeps getting your panties wet or running down your legs You have vaginal bleeding.  It is normal to have a small amount of spotting if your cervix was checked.  You don't feel your baby moving like normal.  If you don't, get you something to eat and drink and lay down and focus on feeling your baby move.   If your baby is still not moving like normal, you should call the office or go to [facility].  Call the office [phone]) or go to [facility] hospital for these signs of pre-eclampsia: Severe headache that does not go away with Tylenol Visual changes- seeing spots, double, blurred vision Pain under your right breast or upper abdomen that does not go away with Tums or heartburn medicine Nausea and/or vomiting Severe swelling in your hands, feet, and face   Tdap Vaccine It is recommended that you get the Tdap vaccine during the third trimester of EACH pregnancy to help protect your baby from getting pertussis (whooping cough) 27-36 weeks is the BEST time to do  this so that you can pass the protection on to your baby. During pregnancy is better than after pregnancy, but if you are unable to get it during pregnancy it will be offered at the hospital.  You can get this vaccine with [place], at the health department, your family doctor, or some local pharmacies Everyone who will be around your baby should also be up-to-date on their vaccines before the baby comes. Adults (who are not pregnant) only need 1 dose of Tdap during adulthood.   [facility] Pediatricians/Family Doctors [facility]): [address] [place], [place] [facility]: [address] [place], [phone]                [facility]): [place], [phone] (call to ask if accepting patients) [employer] Department: [address], [place], [place] Pediatricians/Family Doctors [facility]): [place]. [place], [address], [facility]: [address] [place], [place] [place]: [place], [facility]): [phone] [facility]: [address] [place]: [address] [place] [facility]: [phone], [phone]  Home Blood Pressure Monitoring for Patients   Your provider has recommended that you check your  blood pressure (BP) at least once a week at home. If you do not have a blood pressure cuff at home, one will be provided for you. Contact your provider if you have not received your monitor within 1 week.   Helpful Tips for Accurate Home Blood Pressure Checks  Don't smoke, exercise, or drink caffeine 30 minutes before checking your BP Use the restroom before checking your BP (a full bladder can raise your  pressure) Relax in a comfortable upright chair Feet on the ground Left arm resting comfortably on a flat surface at the level of your heart Legs uncrossed Back supported Sit quietly and don't talk Place the cuff on your bare arm Adjust snuggly, so that only two fingertips can fit between your skin and the top of the cuff Check 2 readings separated by at least one minute Keep a log of your BP readings For a visual, please reference this diagram: http://ccnc.care/bpdiagram  Provider Name: [facility]     Phone: [phone]  Zone 1: ALL CLEAR  Continue to monitor your symptoms:  BP reading is less than 140 (top number) or less than 90 (bottom number)  No right upper stomach pain No headaches or seeing spots No feeling nauseated or throwing up No swelling in face and hands  Zone 2: CAUTION Call your doctor's office for any of the following:  BP reading is greater than 140 (top number) or greater than 90 (bottom number)  Stomach pain under your ribs in the middle or right side Headaches or seeing spots Feeling nauseated or throwing up Swelling in face and hands  Zone 3: EMERGENCY  Seek immediate medical care if you have any of the following:  BP reading is greater than160 (top number) or greater than 110 (bottom number) Severe headaches not improving with Tylenol Serious difficulty catching your breath Any worsening symptoms from Zone 2  Preterm Labor and Birth Information  The normal length of a pregnancy is 39-41 weeks. Preterm labor is when labor starts before 37 completed weeks of pregnancy. What are the risk factors for preterm labor? Preterm labor is more likely to occur in women who: Have certain infections during pregnancy such as a bladder infection, sexually transmitted infection, or infection inside the uterus (chorioamnionitis). Have a shorter-than-normal cervix. Have gone into preterm labor before. Have had surgery on their cervix. Are younger than age 17  or older than age 35. Are African American. Are pregnant with twins or multiple babies (multiple gestation). Take street drugs or smoke while pregnant. Do not gain enough weight while pregnant. Became pregnant shortly after having been pregnant. What are the symptoms of preterm labor? Symptoms of preterm labor include: Cramps similar to those that can happen during a menstrual period. The cramps may happen with diarrhea. Pain in the abdomen or lower back. Regular uterine contractions that may feel like tightening of the abdomen. A feeling of increased pressure in the pelvis. Increased watery or bloody mucus discharge from the vagina. Water breaking (ruptured amniotic sac). Why is it important to recognize signs of preterm labor? It is important to recognize signs of preterm labor because babies who are born prematurely may not be fully developed. This can put them at an increased risk for: Long-term (chronic) heart and lung problems. Difficulty immediately after birth with regulating body systems, including blood sugar, body temperature, heart rate, and breathing rate. Bleeding in the brain. Cerebral palsy. Learning difficulties. Death. These risks are highest for babies who are born before 34 weeks   of pregnancy. How is preterm labor treated? Treatment depends on the length of your pregnancy, your condition, and the health of your baby. It may involve: Having a stitch (suture) placed in your cervix to prevent your cervix from opening too early (cerclage). Taking or being given medicines, such as: Hormone medicines. These may be given early in pregnancy to help support the pregnancy. Medicine to stop contractions. Medicines to help mature the baby's lungs. These may be prescribed if the risk of delivery is high. Medicines to prevent your baby from developing cerebral palsy. If the labor happens before 34 weeks of pregnancy, you may need to stay in the hospital. What should I do if I  think I am in preterm labor? If you think that you are going into preterm labor, call your health care provider right away. How can I prevent preterm labor in future pregnancies? To increase your chance of having a full-term pregnancy: Do not use any tobacco products, such as cigarettes, chewing tobacco, and e-cigarettes. If you need help quitting, ask your health care provider. Do not use street drugs or medicines that have not been prescribed to you during your pregnancy. Talk with your health care provider before taking any herbal supplements, even if you have been taking them regularly. Make sure you gain a healthy amount of weight during your pregnancy. Watch for infection. If you think that you might have an infection, get it checked right away. Make sure to tell your health care provider if you have gone into preterm labor before. This information is not intended to replace advice given to you by your health care provider. Make sure you discuss any questions you have with your health care provider. Document Revised: 08/01/2018 Document Reviewed: 08/31/2015 Elsevier Patient Education  2020 Elsevier Inc.   

## 2022-01-08 NOTE — Progress Notes (Signed)
HIGH-RISK PREGNANCY VISIT Patient name: Monica Kramer MRN [id number]  Date of birth: [birth year] Chief Complaint:   Routine Prenatal Visit  History of Present Illness:   Monica Kramer is a 21 y.o. G48P0000 female at [redacted]w[redacted]d with an Estimated Date of Delivery: 03/06/22 being seen today for ongoing management of a high-risk pregnancy complicated by severe FGR dx @ 20wks, EFW currently 0.13% w/ elevated UAD 97%.    Today she reports no complaints. Contractions: Not present. Vag. Bleeding: None.  Movement: Present. denies leaking of fluid.      09/20/2021    2:07 PM 09/07/2020   11:19 AM 11/13/2017    1:16 PM  Depression screen PHQ 2/9  Decreased Interest 2 1 0  Down, Depressed, Hopeless 0 1 0  PHQ - 2 Score 2 2 0  Altered sleeping 0 1 0  Tired, decreased energy 1 0 0  Change in appetite 1 0 0  Feeling bad or failure about yourself  0 0 0  Trouble concentrating 0 0 0  Moving slowly or fidgety/restless 0 0 0  Suicidal thoughts 0 0 0  PHQ-9 Score 4 3 0  Difficult doing work/chores   Not difficult at all        09/20/2021    2:07 PM 09/07/2020   11:19 AM  GAD 7 : Generalized Anxiety Score  Nervous, Anxious, on Edge 0 0  Control/stop worrying 0 2  Worry too much - different things 0 2  Trouble relaxing 0 1  Restless 0 0  Easily annoyed or irritable 2 2  Afraid - awful might happen 0 0  Total GAD 7 Score 2 7     Review of Systems:   Pertinent items are noted in HPI Denies abnormal vaginal discharge w/ itching/odor/irritation, headaches, visual changes, shortness of breath, chest pain, abdominal pain, severe nausea/vomiting, or problems with urination or bowel movements unless otherwise stated above. Pertinent History Reviewed:  Reviewed past medical,surgical, social, obstetrical and family history.  Reviewed problem list, medications and allergies. Physical Assessment:   Vitals:   01/08/22 1454  BP: 121/64  Pulse: 75  Weight: 152 lb (68.9 kg)  Body mass index is 26.93  kg/m.           Physical Examination:   General appearance: alert, well appearing, and in no distress  Mental status: alert, oriented to person, place, and time  Skin: warm & dry   Extremities: Edema: None    Cardiovascular: normal heart rate noted  Respiratory: normal respiratory effort, no distress  Abdomen: gravid, soft, non-tender  Pelvic: Cervical exam deferred         Fetal Status: [place] 61+9 wks,cephalic,BPP 5/0,DTO 671 bpm,RI .75,.80,.76,.80=97%,elevated UAD with EDF,posterior placenta gr 3,EFW 1155 g .13%,AFI 14 CM  Fetal Surveillance Testing today: NST: FHR baseline 135 bpm, Variability: moderate, Accelerations:present, Decelerations:  Absent= Cat 1/reactive Toco: none   Chaperone: N/A    No results found for this or any previous visit (from the past 24 hour(s)).  Assessment & Plan:  High-risk pregnancy: G1P0000 at [redacted]w[redacted]d with an Estimated Date of Delivery: 03/06/22   1) Severe FGR, today EFW  0.13% & AC 0.1% (was 0.17% & 0.03% at 28.6wk). Has gained from 829g to 1155g  2) Elevated UAD, 95% with good EDF, no absent/reverse  3) Inadequate weight gain> 154lb pre pregnancy, now 152lb, no n/v/d, discussed milk shakes, Boost/Ensure  Meds: No orders of the defined types were placed in this encounter.   Labs/procedures today: NST and  U/S  Treatment Plan:  weekly dopplers, 2x/wk NST, deliver @ 37wks unless indicated earlier  Reviewed: Preterm labor symptoms and general obstetric precautions including but not limited to vaginal bleeding, contractions, leaking of fluid and fetal movement were reviewed in detail with the patient.  All questions were answered. Does have home bp cuff. Office bp cuff given: not applicable. Check bp weekly, let us know if consistently >140 and/or >90.  Follow-up: Return for As scheduled.   Future Appointments  Date Time Provider [place]  01/11/2022  3:10 PM [facility] NURSE [facility]  01/15/2022  1:30 PM [place] - [facility] [place] [facility]  None  01/15/2022  2:30 PM Florian Buff, MD [facility]  01/18/2022  3:30 PM [facility] NURSE [facility]  01/22/2022  2:15 PM [place] - [facility] [place] [facility] None  01/22/2022  3:10 PM Florian Buff, MD [facility]  01/25/2022  3:30 PM [facility] NURSE [facility]  01/29/2022  1:30 PM [place] - [facility] [place] [facility] None  01/29/2022  2:30 PM Radene Gunning, MD [facility]  02/01/2022  3:10 PM [facility] NURSE [facility]  02/06/2022  1:30 PM [place] - [facility] [place] [facility] None  02/06/2022  2:30 PM Florian Buff, MD [facility]  02/08/2022  9:50 AM [facility] NURSE [facility]  02/12/2022  1:30 PM [place] - [facility] [place] [facility] None  02/12/2022  2:30 PM Florian Buff, MD [facility]  02/15/2022  3:30 PM [facility] NURSE [facility]  02/19/2022  1:30 PM [place] - [facility] [place] [facility] None  02/19/2022  2:30 PM Janyth Pupa, DO [facility]  02/22/2022  3:30 PM [facility] NURSE [facility]  02/26/2022  1:30 PM [place] - [facility]YN [place] [facility] None  02/26/2022  2:30 PM Florian Buff, MD [facility]  03/01/2022  3:30 PM [facility] NURSE [facility]  03/05/2022  1:30 PM [place] - [facility] [place] [facility] None  03/05/2022  2:30 PM Cresenzo-Dishmon, Joaquim Lai, CNM [facility]    No orders of the defined types were placed in this encounter.  [place], [facility] 01/08/2022 3:17 PM

## 2022-01-08 NOTE — Progress Notes (Signed)
[place] 81+2 wks,cephalic,BPP 7/5,TZG 017 bpm,RI .75,.80,.76,.80=97%,elevated UAD with EDF,posterior placenta gr 3,EFW 1155 g .13%,AFI 14 CM

## 2022-01-11 ENCOUNTER — Ambulatory Visit (INDEPENDENT_AMBULATORY_CARE_PROVIDER_SITE_OTHER): Payer: Medicaid Other | Admitting: *Deleted

## 2022-01-11 DIAGNOSIS — O0993 Supervision of high risk pregnancy, unspecified, third trimester: Secondary | ICD-10-CM

## 2022-01-11 DIAGNOSIS — Z3A32 32 weeks gestation of pregnancy: Secondary | ICD-10-CM | POA: Diagnosis not present

## 2022-01-11 DIAGNOSIS — O365990 Maternal care for other known or suspected poor fetal growth, unspecified trimester, not applicable or unspecified: Secondary | ICD-10-CM

## 2022-01-11 NOTE — Progress Notes (Signed)
   NURSE VISIT- NST  SUBJECTIVE:  Monica Kramer is a 21 y.o. G52P0000 female at [redacted]w[redacted]d, here for a NST for pregnancy complicated by FGR.  She reports active fetal movement, contractions: none, vaginal bleeding: none, membranes: intact.   OBJECTIVE:  BP 114/69   Pulse 97   Wt 154 lb 6.4 oz (70 kg)   LMP 06/07/2021 (Approximate)   BMI 27.35 kg/m   Appears well, no apparent distress  No results found for this or any previous visit (from the past 24 hour(s)).  NST: FHR baseline 140 bpm, Variability: moderate, Accelerations:present, Decelerations:  Absent= Cat 1/reactive Toco: none   ASSESSMENT: G1P0000 at [redacted]w[redacted]d with FGR NST reactive  PLAN: EFM strip reviewed by Dr. Elonda Husky   Recommendations: keep next appointment as scheduled    Alice Rieger  01/11/2022 4:51 PM

## 2022-01-12 ENCOUNTER — Other Ambulatory Visit: Payer: Self-pay | Admitting: Obstetrics & Gynecology

## 2022-01-12 DIAGNOSIS — O365931 Maternal care for other known or suspected poor fetal growth, third trimester, fetus 1: Secondary | ICD-10-CM

## 2022-01-15 ENCOUNTER — Ambulatory Visit (INDEPENDENT_AMBULATORY_CARE_PROVIDER_SITE_OTHER): Payer: Medicaid Other

## 2022-01-15 ENCOUNTER — Ambulatory Visit (INDEPENDENT_AMBULATORY_CARE_PROVIDER_SITE_OTHER): Payer: Medicaid Other | Admitting: Obstetrics & Gynecology

## 2022-01-15 ENCOUNTER — Encounter: Payer: Self-pay | Admitting: Obstetrics & Gynecology

## 2022-01-15 VITALS — BP 102/66 | HR 69 | Wt 154.0 lb

## 2022-01-15 DIAGNOSIS — Z3A32 32 weeks gestation of pregnancy: Secondary | ICD-10-CM

## 2022-01-15 DIAGNOSIS — O0993 Supervision of high risk pregnancy, unspecified, third trimester: Secondary | ICD-10-CM

## 2022-01-15 DIAGNOSIS — O365990 Maternal care for other known or suspected poor fetal growth, unspecified trimester, not applicable or unspecified: Secondary | ICD-10-CM | POA: Diagnosis not present

## 2022-01-15 DIAGNOSIS — O365931 Maternal care for other known or suspected poor fetal growth, third trimester, fetus 1: Secondary | ICD-10-CM

## 2022-01-15 DIAGNOSIS — R8271 Bacteriuria: Secondary | ICD-10-CM

## 2022-01-15 NOTE — Progress Notes (Signed)
[place] 53+6 wks,cephalic,BPP 6/4,[id number] placenta gr 3,FHR 126 bpm,AFI 10.8 cm,elevated UAD with EDF,RI .71,.72,.74=91%

## 2022-01-15 NOTE — Progress Notes (Signed)
HIGH-RISK PREGNANCY VISIT Patient name: Monica Kramer MRN [id number]  Date of birth: Jul 31, 2000 Chief Complaint:   Routine Prenatal Visit, Non-stress Test, and Pregnancy Ultrasound  History of Present Illness:   Monica Kramer is a 21 y.o. G39P0000 female at [redacted]w[redacted]d with an Estimated Date of Delivery: 03/06/22 being seen today for ongoing management of a high-risk pregnancy complicated by fetal growth restriction .13% with elevated UAD, now 91%, has been higher, so a bit better than it has benn  Today she reports no complaints. Contractions: Not present.  .  Movement: Present. denies leaking of fluid.      09/20/2021    2:07 PM 09/07/2020   11:19 AM 11/13/2017    1:16 PM  Depression screen PHQ 2/9  Decreased Interest 2 1 0  Down, Depressed, Hopeless 0 1 0  PHQ - 2 Score 2 2 0  Altered sleeping 0 1 0  Tired, decreased energy 1 0 0  Change in appetite 1 0 0  Feeling bad or failure about yourself  0 0 0  Trouble concentrating 0 0 0  Moving slowly or fidgety/restless 0 0 0  Suicidal thoughts 0 0 0  PHQ-9 Score 4 3 0  Difficult doing work/chores   Not difficult at all        09/20/2021    2:07 PM 09/07/2020   11:19 AM  GAD 7 : Generalized Anxiety Score  Nervous, Anxious, on Edge 0 0  Control/stop worrying 0 2  Worry too much - different things 0 2  Trouble relaxing 0 1  Restless 0 0  Easily annoyed or irritable 2 2  Afraid - awful might happen 0 0  Total GAD 7 Score 2 7     Review of Systems:   Pertinent items are noted in HPI Denies abnormal vaginal discharge w/ itching/odor/irritation, headaches, visual changes, shortness of breath, chest pain, abdominal pain, severe nausea/vomiting, or problems with urination or bowel movements unless otherwise stated above. Pertinent History Reviewed:  Reviewed past medical,surgical, social, obstetrical and family history.  Reviewed problem list, medications and allergies. Physical Assessment:   Vitals:   01/15/22 1411  BP:  102/66  Pulse: 69  Weight: 154 lb (69.9 kg)  Body mass index is 27.28 kg/m.           Physical Examination:   General appearance: alert, well appearing, and in no distress  Mental status: alert, oriented to person, place, and time  Skin: warm & dry   Extremities: Edema: None    Cardiovascular: normal heart rate noted  Respiratory: normal respiratory effort, no distress  Abdomen: gravid, soft, non-tender  Pelvic: Cervical exam deferred         Fetal Status:     Movement: Present    Fetal Surveillance Testing today: BPP 8/8 with 91%, + reactive NST   Monica Kramer is at [redacted]w[redacted]d Estimated Date of Delivery: 03/06/22  NST being performed due to [place]  Today the NST is Reactive  Fetal Monitoring:  Baseline: 140 bpm, Variability: Good {> 6 bpm), Accelerations: Reactive, and Decelerations: Absent   reactive  The accelerations are >15 bpm and more than 2 in 20 minutes  Final diagnosis:  Reactive NST  Florian Buff, MD    Chaperone: N/A    No results found for this or any previous visit (from the past 24 hour(s)).  Assessment & Plan:  High-risk pregnancy: G1P0000 at [redacted]w[redacted]d with an Estimated Date of Delivery: 03/06/22  ICD-10-CM   1. Supervision of high risk pregnancy in third trimester  O09.93     2. Fetal growth restriction antepartum with elevated UAD  O36.5990    91% today, BPP 10/10        Meds: No orders of the defined types were placed in this encounter.   Orders: No orders of the defined types were placed in this encounter.    Labs/procedures today: NST and U/S  Treatment Plan:  continue current care plan, twice weekly NST with BPP/UAD once a week, IOL 37 weeks unless otherwise clinically indicated    Follow-up: No follow-ups on file.   Future Appointments  Date Time Provider [place]  01/18/2022  3:30 PM [facility] NURSE [facility]  01/22/2022  2:15 PM [place] - [facility] [place] [facility] None  01/22/2022  3:10 PM Florian Buff, MD [facility]   01/25/2022  3:30 PM [facility] NURSE [facility]  01/29/2022  1:30 PM [place] - [facility]YN [place] [facility] None  01/29/2022  2:30 PM Radene Gunning, MD [facility]  02/01/2022  3:10 PM [facility] NURSE [facility]  02/06/2022  1:30 PM [place] - [facility] [place] [facility] None  02/06/2022  2:30 PM Florian Buff, MD [facility]  02/08/2022  9:50 AM [facility] NURSE [facility]  02/12/2022  1:30 PM [place] - [facility] [place] [facility] None  02/12/2022  2:30 PM Florian Buff, MD [facility]  02/15/2022  3:30 PM [facility] NURSE [facility]  02/19/2022  1:30 PM [place] - [facility] [place] [facility] None  02/19/2022  2:30 PM Janyth Pupa, DO [facility]  02/22/2022  3:30 PM [facility] NURSE [facility]  02/26/2022  1:30 PM [place] - [facility] [place] [facility] None  02/26/2022  2:30 PM Florian Buff, MD [facility]  03/01/2022  3:30 PM [facility] NURSE [facility]  03/05/2022  1:30 PM [place] - [facility]YN [place] [facility] None  03/05/2022  2:30 PM Cresenzo-Dishmon, Joaquim Lai, CNM [facility]    No orders of the defined types were placed in this encounter.  Florian Buff  Attending Physician for the [facility] 01/15/2022 3:10 PM

## 2022-01-18 ENCOUNTER — Ambulatory Visit (INDEPENDENT_AMBULATORY_CARE_PROVIDER_SITE_OTHER): Payer: Medicaid Other | Admitting: *Deleted

## 2022-01-18 DIAGNOSIS — Z3A33 33 weeks gestation of pregnancy: Secondary | ICD-10-CM | POA: Diagnosis not present

## 2022-01-18 DIAGNOSIS — O365990 Maternal care for other known or suspected poor fetal growth, unspecified trimester, not applicable or unspecified: Secondary | ICD-10-CM | POA: Diagnosis not present

## 2022-01-18 DIAGNOSIS — O0993 Supervision of high risk pregnancy, unspecified, third trimester: Secondary | ICD-10-CM | POA: Diagnosis not present

## 2022-01-18 NOTE — Progress Notes (Signed)
140

## 2022-01-18 NOTE — Progress Notes (Signed)
   NURSE VISIT- NST  SUBJECTIVE:  Monica Kramer is a 21 y.o. G15P0000 female at [redacted]w[redacted]d, here for a NST for pregnancy complicated by FGR.  She reports active fetal movement, contractions: occasional, vaginal bleeding: none, membranes: intact.   OBJECTIVE:  BP 110/66   Pulse 65   Temp 99 F (37.2 C)   Wt 155 lb 12.8 oz (70.7 kg)   LMP 06/07/2021 (Approximate)   BMI 27.60 kg/m   Appears well, no apparent distress  No results found for this or any previous visit (from the past 24 hour(s)).  NST: FHR baseline 140 bpm, Variability: moderate, Accelerations:present, Decelerations:  Absent= Cat 1/reactive Toco: occasional   ASSESSMENT: G1P0000 at [redacted]w[redacted]d with FGR NST reactive  PLAN: EFM strip reviewed by Dr. Elonda Husky   Recommendations: keep next appointment as scheduled    Alice Rieger  01/18/2022 4:40 PM

## 2022-01-19 ENCOUNTER — Other Ambulatory Visit: Payer: Self-pay | Admitting: Obstetrics & Gynecology

## 2022-01-19 DIAGNOSIS — O365930 Maternal care for other known or suspected poor fetal growth, third trimester, not applicable or unspecified: Secondary | ICD-10-CM

## 2022-01-22 ENCOUNTER — Ambulatory Visit (INDEPENDENT_AMBULATORY_CARE_PROVIDER_SITE_OTHER): Payer: Medicaid Other | Admitting: Obstetrics & Gynecology

## 2022-01-22 ENCOUNTER — Ambulatory Visit (INDEPENDENT_AMBULATORY_CARE_PROVIDER_SITE_OTHER): Payer: Medicaid Other

## 2022-01-22 ENCOUNTER — Encounter: Payer: Self-pay | Admitting: Obstetrics & Gynecology

## 2022-01-22 ENCOUNTER — Other Ambulatory Visit: Payer: Self-pay | Admitting: Obstetrics & Gynecology

## 2022-01-22 VITALS — BP 121/78 | HR 78 | Wt 155.0 lb

## 2022-01-22 DIAGNOSIS — Z3A33 33 weeks gestation of pregnancy: Secondary | ICD-10-CM | POA: Diagnosis not present

## 2022-01-22 DIAGNOSIS — O365990 Maternal care for other known or suspected poor fetal growth, unspecified trimester, not applicable or unspecified: Secondary | ICD-10-CM

## 2022-01-22 DIAGNOSIS — O365930 Maternal care for other known or suspected poor fetal growth, third trimester, not applicable or unspecified: Secondary | ICD-10-CM

## 2022-01-22 DIAGNOSIS — O0993 Supervision of high risk pregnancy, unspecified, third trimester: Secondary | ICD-10-CM

## 2022-01-22 DIAGNOSIS — R8271 Bacteriuria: Secondary | ICD-10-CM

## 2022-01-22 NOTE — Progress Notes (Signed)
HIGH-RISK PREGNANCY VISIT Patient name: Monica BlackerKyrah J Carreras MRN [id number]  Date of birth: 11-Jan-2001 Chief Complaint:   Routine Prenatal Visit (US and NST)  History of Present Illness:   Monica Kramer is a 21 y.o. 751P0000 female at 7672w6d with an Estimated Date of Delivery: 03/06/22 being seen today for ongoing management of a high-risk pregnancy complicated by FGR, severe/early with elevated Dopplers.    Today she reports no complaints. Contractions: Not present. Vag. Bleeding: None.  Movement: Present. denies leaking of fluid.      09/20/2021    2:07 PM 09/07/2020   11:19 AM 11/13/2017    1:16 PM  Depression screen PHQ 2/9  Decreased Interest 2 1 0  Down, Depressed, Hopeless 0 1 0  PHQ - 2 Score 2 2 0  Altered sleeping 0 1 0  Tired, decreased energy 1 0 0  Change in appetite 1 0 0  Feeling bad or failure about yourself  0 0 0  Trouble concentrating 0 0 0  Moving slowly or fidgety/restless 0 0 0  Suicidal thoughts 0 0 0  PHQ-9 Score 4 3 0  Difficult doing work/chores   Not difficult at all        09/20/2021    2:07 PM 09/07/2020   11:19 AM  GAD 7 : Generalized Anxiety Score  Nervous, Anxious, on Edge 0 0  Control/stop worrying 0 2  Worry too much - different things 0 2  Trouble relaxing 0 1  Restless 0 0  Easily annoyed or irritable 2 2  Afraid - awful might happen 0 0  Total GAD 7 Score 2 7     Review of Systems:   Pertinent items are noted in HPI Denies abnormal vaginal discharge w/ itching/odor/irritation, headaches, visual changes, shortness of breath, chest pain, abdominal pain, severe nausea/vomiting, or problems with urination or bowel movements unless otherwise stated above. Pertinent History Reviewed:  Reviewed past medical,surgical, social, obstetrical and family history.  Reviewed problem list, medications and allergies. Physical Assessment:   Vitals:   01/22/22 1511  BP: 121/78  Pulse: 78  Weight: 155 lb (70.3 kg)  Body mass index is 27.46  kg/m.           Physical Examination:   General appearance: alert, well appearing, and in no distress  Mental status: alert, oriented to person, place, and time  Skin: warm & dry   Extremities: Edema: None    Cardiovascular: normal heart rate noted  Respiratory: normal respiratory effort, no distress  Abdomen: gravid, soft, non-tender  Pelvic: Cervical exam deferred         Fetal Status:     Movement: Present    Fetal Surveillance Testing today: BPP 6/8(2 off for breathing,) UAD 96%  Reatvie NST  Monica BlackerKyrah J Purdum is at 5372w6d Estimated Date of Delivery: 03/06/22  NST being performed due to FGR, BPP 6/8  Today the NST is Reactive  Fetal Monitoring:  Baseline: 140 bpm, Variability: Good {> 6 bpm), Accelerations: Reactive, and Decelerations: Absent   reactive  The accelerations are >15 bpm and more than 2 in 20 minutes  Final diagnosis:  Reactive NST  Lazaro ArmsLuther H Yenesis Even, MD     Chaperone: N/A    No results found for this or any previous visit (from the past 24 hour(s)).  Assessment & Plan:  High-risk pregnancy: G1P0000 at 5972w6d with an Estimated Date of Delivery: 03/06/22      ICD-10-CM   1. Supervision of high risk pregnancy  in third trimester  O09.93     2. Fetal growth restriction antepartum, severe/early with elevated UAD ratios  O36.5990          Meds: No orders of the defined types were placed in this encounter.   Orders: No orders of the defined types were placed in this encounter.    Labs/procedures today: NST and U/S  Treatment Plan:  twice weekly surveillance, IOL 37 weeks or as clinically indicated   Follow-up: Return for keep scheduled.   Future Appointments  Date Time Provider [place]  01/25/2022  3:30 PM [facility] NURSE [facility]  01/29/2022  1:30 PM [place] - [facility] [place] [facility] None  01/29/2022  2:30 PM Radene Gunning, MD [facility]  02/01/2022  3:10 PM [facility] NURSE [facility]  02/06/2022  1:30 PM [place] - [facility] [place]  [facility] None  02/06/2022  2:30 PM Florian Buff, MD [facility]  02/08/2022  9:50 AM [facility] NURSE [facility]  02/12/2022  1:30 PM [place] - [facility] [place] [facility] None  02/12/2022  2:30 PM Florian Buff, MD [facility]  02/15/2022  3:30 PM [facility] NURSE [facility]  02/19/2022  1:30 PM [place] - [facility] [place] [facility] None  02/19/2022  2:30 PM Janyth Pupa, DO [facility]  02/22/2022  3:30 PM [facility] NURSE [facility]  02/26/2022  1:30 PM [place] - [facility] [place] [facility] None  02/26/2022  2:30 PM Florian Buff, MD [facility]  03/01/2022  3:30 PM [facility] NURSE [facility]  03/05/2022  1:30 PM [place] - [facility] [place] [facility] None  03/05/2022  2:30 PM Cresenzo-Dishmon, Joaquim Lai, CNM [facility]    No orders of the defined types were placed in this encounter.  Florian Buff  Attending Physician for the [facility] 01/22/2022 3:51 PM

## 2022-01-22 NOTE — Progress Notes (Signed)
[place] 46+5 wks,cephalic,posterior placenta gr 3,BPP 6/8 no breathing,AFI 13 cm,RI .76,.75,.73=96%,elevated UAD with EDF

## 2022-01-25 ENCOUNTER — Ambulatory Visit (INDEPENDENT_AMBULATORY_CARE_PROVIDER_SITE_OTHER): Payer: Medicaid Other | Admitting: *Deleted

## 2022-01-25 DIAGNOSIS — Z3A34 34 weeks gestation of pregnancy: Secondary | ICD-10-CM | POA: Diagnosis not present

## 2022-01-25 DIAGNOSIS — O365990 Maternal care for other known or suspected poor fetal growth, unspecified trimester, not applicable or unspecified: Secondary | ICD-10-CM | POA: Diagnosis not present

## 2022-01-25 DIAGNOSIS — O0993 Supervision of high risk pregnancy, unspecified, third trimester: Secondary | ICD-10-CM | POA: Diagnosis not present

## 2022-01-25 NOTE — Progress Notes (Signed)
   NURSE VISIT- NST  SUBJECTIVE:  Monica Kramer is a 21 y.o. G1P0000 female at [redacted]w[redacted]d, here for a NST for pregnancy complicated by FGR.  She reports active fetal movement, contractions: occasional, vaginal bleeding: none, membranes: intact.   OBJECTIVE:  BP 101/62   Pulse 76   Wt 156 lb (70.8 kg)   LMP 06/07/2021 (Approximate)   BMI 27.63 kg/m   Appears well, no apparent distress  No results found for this or any previous visit (from the past 24 hour(s)).  NST: FHR baseline 120 bpm, Variability: moderate, Accelerations:present, Decelerations:  Absent= Cat 1/reactive Toco: occasional   ASSESSMENT: G1P0000 at [redacted]w[redacted]d with FGR NST reactive  PLAN: EFM strip reviewed by Dr. Nelda Marseille   Recommendations: keep next appointment as scheduled    Alice Rieger  01/25/2022 4:43 PM

## 2022-01-26 ENCOUNTER — Other Ambulatory Visit: Payer: Self-pay | Admitting: Obstetrics & Gynecology

## 2022-01-26 DIAGNOSIS — O365932 Maternal care for other known or suspected poor fetal growth, third trimester, fetus 2: Secondary | ICD-10-CM

## 2022-01-29 ENCOUNTER — Ambulatory Visit (INDEPENDENT_AMBULATORY_CARE_PROVIDER_SITE_OTHER): Payer: Medicaid Other | Admitting: Obstetrics and Gynecology

## 2022-01-29 ENCOUNTER — Ambulatory Visit (INDEPENDENT_AMBULATORY_CARE_PROVIDER_SITE_OTHER): Payer: Medicaid Other

## 2022-01-29 ENCOUNTER — Encounter: Payer: Self-pay | Admitting: Obstetrics and Gynecology

## 2022-01-29 VITALS — BP 115/68 | HR 74 | Wt 155.5 lb

## 2022-01-29 DIAGNOSIS — O365932 Maternal care for other known or suspected poor fetal growth, third trimester, fetus 2: Secondary | ICD-10-CM | POA: Diagnosis not present

## 2022-01-29 DIAGNOSIS — R8271 Bacteriuria: Secondary | ICD-10-CM

## 2022-01-29 DIAGNOSIS — O0993 Supervision of high risk pregnancy, unspecified, third trimester: Secondary | ICD-10-CM

## 2022-01-29 DIAGNOSIS — Z3A34 34 weeks gestation of pregnancy: Secondary | ICD-10-CM

## 2022-01-29 DIAGNOSIS — O365990 Maternal care for other known or suspected poor fetal growth, unspecified trimester, not applicable or unspecified: Secondary | ICD-10-CM

## 2022-01-29 NOTE — Progress Notes (Signed)
   PRENATAL VISIT NOTE  Subjective:  Monica Kramer is a 21 y.o. G1P0000 at [redacted]w[redacted]d being seen today for ongoing prenatal care.  She is currently monitored for the following issues for this high-risk pregnancy and has Supervision of high-risk pregnancy; GBS bacteriuria; and Fetal growth restriction antepartum on their problem list.  Patient reports no complaints.  Contractions: Not present. Vag. Bleeding: None.  Movement: Present. Denies leaking of fluid.   The following portions of the patient's history were reviewed and updated as appropriate: allergies, current medications, past family history, past medical history, past social history, past surgical history and problem list.   Objective:   Vitals:   01/29/22 1437  BP: 115/68  Pulse: 74  Weight: 155 lb 8 oz (70.5 kg)    Fetal Status: Fetal Heart Rate (bpm): 145 Fundal Height: 31 cm Movement: Present     General:  Alert, oriented and cooperative. Patient is in no acute distress.  Skin: Skin is warm and dry. No rash noted.   Cardiovascular: Normal heart rate noted  Respiratory: Normal respiratory effort, no problems with respiration noted  Abdomen: Soft, gravid, appropriate for gestational age.  Pain/Pressure: Absent     Pelvic: Cervical exam deferred        Extremities: Normal range of motion.  Edema: None  Mental Status: Normal mood and affect. Normal behavior. Normal judgment and thought content.   Assessment and Plan:  Pregnancy: G1P0000 at [redacted]w[redacted]d 1. Supervision of high risk pregnancy in third trimester No issues today  2. GBS bacteriuria Discussed again PCN in labor - no allergies.   3. Fetal growth restriction antepartum She is comfortable with plan of care. Twice weekly antenatal testing. She is aware IOL at 37w unless indicated sooner by testing. Schedule at next appt (37w is 10/24). BPP today 8/8.   Preterm labor symptoms and general obstetric precautions including but not limited to vaginal bleeding, contractions,  leaking of fluid and fetal movement were reviewed in detail with the patient. Please refer to After Visit Summary for other counseling recommendations.   Return in about 1 week (around 02/05/2022), or Has appt Thursday as well.  Future Appointments  Date Time Provider [place]  02/01/2022  3:10 PM [facility] NURSE [facility]  02/06/2022  1:30 PM [place] - [facility] [place] [facility] None  02/06/2022  2:30 PM Florian Buff, MD [facility]  02/08/2022  9:50 AM [facility] NURSE [facility]  02/12/2022  1:30 PM [place] - [facility] [place] [facility] None  02/12/2022  2:30 PM Florian Buff, MD [facility]  02/15/2022  3:30 PM [facility] NURSE [facility]  02/19/2022  1:30 PM [place] - [facility] [place] [facility] None  02/19/2022  2:30 PM Janyth Pupa, DO [facility]  02/22/2022  3:30 PM [facility] NURSE [facility]  02/26/2022  1:30 PM [place] - [facility] [place] [facility] None  02/26/2022  2:30 PM Florian Buff, MD [facility]  03/01/2022  3:30 PM [facility] NURSE [facility]  03/05/2022  1:30 PM [place] - [facility] [place] [facility] None  03/05/2022  2:30 PM Christin Fudge, CNM [facility]    Radene Gunning, MD

## 2022-01-29 NOTE — Progress Notes (Signed)
[place] 45+6 wks,cephalic,posterior placenta gr 3,FHR 142 bpm,BPP 8/8,elevated UAD with EDF,RI .75,.71,.76=96%,AFI 15 cm,EFW 1562 g .16%

## 2022-02-01 ENCOUNTER — Ambulatory Visit (INDEPENDENT_AMBULATORY_CARE_PROVIDER_SITE_OTHER): Payer: Medicaid Other | Admitting: *Deleted

## 2022-02-01 VITALS — BP 112/65 | HR 100 | Wt 154.0 lb

## 2022-02-01 DIAGNOSIS — O365990 Maternal care for other known or suspected poor fetal growth, unspecified trimester, not applicable or unspecified: Secondary | ICD-10-CM

## 2022-02-01 DIAGNOSIS — O0993 Supervision of high risk pregnancy, unspecified, third trimester: Secondary | ICD-10-CM | POA: Diagnosis not present

## 2022-02-01 DIAGNOSIS — Z3A35 35 weeks gestation of pregnancy: Secondary | ICD-10-CM

## 2022-02-01 NOTE — Progress Notes (Addendum)
   NURSE VISIT- NST  SUBJECTIVE:  Monica Kramer is a 21 y.o. G43P0000 female at [redacted]w[redacted]d, here for a NST for pregnancy complicated by FGR.  She reports active fetal movement, contractions: none, vaginal bleeding: none, membranes: intact.   OBJECTIVE:  BP 112/65   Pulse 100   Wt 154 lb (69.9 kg)   LMP 06/07/2021 (Approximate)   BMI 27.28 kg/m   Appears well, no apparent distress  No results found for this or any previous visit (from the past 24 hour(s)).  NST: FHR baseline 120 bpm, Variability: moderate, Accelerations:present, Decelerations:  Absent= Cat 1/reactive Toco: none   ASSESSMENT: G1P0000 at [redacted]w[redacted]d with FGR NST reactive  PLAN: EFM strip reviewed by Nigel Berthold, CNM   Recommendations: keep next appointment as scheduled    Alice Rieger  02/01/2022 4:15 PM  Chart reviewed for nurse visit. Agree with plan of care.  Christin Fudge, CNM 02/01/2022 10:30 PM

## 2022-02-05 ENCOUNTER — Other Ambulatory Visit: Payer: Self-pay | Admitting: Obstetrics & Gynecology

## 2022-02-05 ENCOUNTER — Other Ambulatory Visit: Payer: Medicaid Other

## 2022-02-05 DIAGNOSIS — O365931 Maternal care for other known or suspected poor fetal growth, third trimester, fetus 1: Secondary | ICD-10-CM

## 2022-02-06 ENCOUNTER — Encounter: Payer: Self-pay | Admitting: Obstetrics & Gynecology

## 2022-02-06 ENCOUNTER — Ambulatory Visit (INDEPENDENT_AMBULATORY_CARE_PROVIDER_SITE_OTHER): Payer: Medicaid Other

## 2022-02-06 ENCOUNTER — Ambulatory Visit (INDEPENDENT_AMBULATORY_CARE_PROVIDER_SITE_OTHER): Payer: Medicaid Other | Admitting: Obstetrics & Gynecology

## 2022-02-06 ENCOUNTER — Other Ambulatory Visit (HOSPITAL_COMMUNITY)
Admission: RE | Admit: 2022-02-06 | Discharge: 2022-02-06 | Disposition: A | Discharge status: Home / Self Care | Payer: Medicaid Other | Source: Ambulatory Visit | Attending: Obstetrics & Gynecology | Admitting: Obstetrics & Gynecology

## 2022-02-06 VITALS — BP 121/70 | HR 75 | Wt 154.0 lb

## 2022-02-06 DIAGNOSIS — O0993 Supervision of high risk pregnancy, unspecified, third trimester: Secondary | ICD-10-CM | POA: Insufficient documentation

## 2022-02-06 DIAGNOSIS — Z3A36 36 weeks gestation of pregnancy: Secondary | ICD-10-CM | POA: Insufficient documentation

## 2022-02-06 DIAGNOSIS — O365931 Maternal care for other known or suspected poor fetal growth, third trimester, fetus 1: Secondary | ICD-10-CM

## 2022-02-06 DIAGNOSIS — O365990 Maternal care for other known or suspected poor fetal growth, unspecified trimester, not applicable or unspecified: Secondary | ICD-10-CM

## 2022-02-06 NOTE — Progress Notes (Signed)
[place] 36 wks,cephalic,BPP 5/8,IFO 277 bpm,posterior placenta gr 3,AFI 12.8 cm,RI .70,.62,.68=93%,elevated UAD with EDF

## 2022-02-06 NOTE — Progress Notes (Signed)
HIGH-RISK PREGNANCY VISIT Patient name: Monica Kramer MRN [id number]  Date of birth: 2000-08-26 Chief Complaint:   Routine Prenatal Visit  History of Present Illness:   Monica Kramer is a 21 y.o. G44P0000 female at [redacted]w[redacted]d with an Estimated Date of Delivery: 03/06/22 being seen today for ongoing management of a high-risk pregnancy complicated by fetal growth restriction 0.16% with consistently >95% UAD   Today she reports no complaints. Contractions: Not present. Vag. Bleeding: None.  Movement: Present. denies leaking of fluid.      09/20/2021    2:07 PM 09/07/2020   11:19 AM 11/13/2017    1:16 PM  Depression screen PHQ 2/9  Decreased Interest 2 1 0  Down, Depressed, Hopeless 0 1 0  PHQ - 2 Score 2 2 0  Altered sleeping 0 1 0  Tired, decreased energy 1 0 0  Change in appetite 1 0 0  Feeling bad or failure about yourself  0 0 0  Trouble concentrating 0 0 0  Moving slowly or fidgety/restless 0 0 0  Suicidal thoughts 0 0 0  PHQ-9 Score 4 3 0  Difficult doing work/chores   Not difficult at all        09/20/2021    2:07 PM 09/07/2020   11:19 AM  GAD 7 : Generalized Anxiety Score  Nervous, Anxious, on Edge 0 0  Control/stop worrying 0 2  Worry too much - different things 0 2  Trouble relaxing 0 1  Restless 0 0  Easily annoyed or irritable 2 2  Afraid - awful might happen 0 0  Total GAD 7 Score 2 7     Review of Systems:   Pertinent items are noted in HPI Denies abnormal vaginal discharge w/ itching/odor/irritation, headaches, visual changes, shortness of breath, chest pain, abdominal pain, severe nausea/vomiting, or problems with urination or bowel movements unless otherwise stated above. Pertinent History Reviewed:  Reviewed past medical,surgical, social, obstetrical and family history.  Reviewed problem list, medications and allergies. Physical Assessment:   Vitals:   02/06/22 1420  BP: 121/70  Pulse: 75  Weight: 154 lb (69.9 kg)  Body mass index is 27.28  kg/m.           Physical Examination:   General appearance: alert, well appearing, and in no distress  Mental status: alert, oriented to person, place, and time  Skin: warm & dry   Extremities: Edema: None    Cardiovascular: normal heart rate noted  Respiratory: normal respiratory effort, no distress  Abdomen: gravid, soft, non-tender  Pelvic: Cervical exam deferred         Fetal Status:     Movement: Present    Fetal Surveillance Testing today: reactive NST     Chaperone:  Calandria Johnson      No results found for this or any previous visit (from the past 24 hour(s)).  Assessment & Plan:  High-risk pregnancy: G1P0000 at [redacted]w[redacted]d with an Estimated Date of Delivery: 03/06/22      ICD-10-CM   1. Supervision of high risk pregnancy in third trimester  O09.93 Cervicovaginal ancillary only    Culture, beta strep (group b only)    2. Fetal growth restriction antepartum  O36.5990    0.16% with UAD >95% consistently    3. [redacted] weeks gestation of pregnancy  Z3A.36 Cervicovaginal ancillary only    Culture, beta strep (group b only)        Meds: No orders of the defined types were placed in this  encounter.   Orders:  Orders Placed This Encounter  Procedures   Culture, beta strep (group b only)     Labs/procedures today: NST  Treatment Plan:  IOL [redacted]w[redacted]d-->scheduled    Follow-up: No follow-ups on file.   Future Appointments  Date Time Provider [place]  02/08/2022  9:50 AM [facility] NURSE [facility]  02/13/2022 12:00 AM [facility] [place] [facility] None    Orders Placed This Encounter  Procedures   Culture, beta strep (group b only)   Florian Buff  Attending Physician for the [facility] 02/06/2022 4:51 PM

## 2022-02-08 ENCOUNTER — Telehealth (HOSPITAL_COMMUNITY): Payer: Self-pay | Admitting: *Deleted

## 2022-02-08 ENCOUNTER — Ambulatory Visit (INDEPENDENT_AMBULATORY_CARE_PROVIDER_SITE_OTHER): Payer: Medicaid Other | Admitting: *Deleted

## 2022-02-08 ENCOUNTER — Encounter (HOSPITAL_COMMUNITY): Payer: Self-pay

## 2022-02-08 DIAGNOSIS — O365990 Maternal care for other known or suspected poor fetal growth, unspecified trimester, not applicable or unspecified: Secondary | ICD-10-CM

## 2022-02-08 DIAGNOSIS — O0993 Supervision of high risk pregnancy, unspecified, third trimester: Secondary | ICD-10-CM

## 2022-02-08 DIAGNOSIS — Z3A36 36 weeks gestation of pregnancy: Secondary | ICD-10-CM | POA: Diagnosis not present

## 2022-02-08 LAB — CERVICOVAGINAL ANCILLARY ONLY
Chlamydia: POSITIVE — AB
Comment: NEGATIVE
Comment: NORMAL
Neisseria Gonorrhea: POSITIVE — AB

## 2022-02-08 NOTE — Progress Notes (Signed)
   NURSE VISIT- NST  SUBJECTIVE:  Monica Kramer is a 21 y.o. G13P0000 female at [redacted]w[redacted]d, here for a NST for pregnancy complicated by FGR.  She reports active fetal movement, contractions: none, vaginal bleeding: none, membranes: intact.   OBJECTIVE:  BP 105/76   Pulse 65   Wt 154 lb 9.6 oz (70.1 kg)   LMP 06/07/2021 (Approximate)   BMI 27.39 kg/m   Appears well, no apparent distress  No results found for this or any previous visit (from the past 24 hour(s)).  NST: FHR baseline 120 bpm(tracing 10 beats off on NST paper) , Variability: moderate, Accelerations:present, Decelerations:  Absent= Cat 1/reactive Toco: UI   ASSESSMENT: G1P0000 at [redacted]w[redacted]d with FGR NST reactive  PLAN: EFM strip reviewed by Dr. Elonda Husky   Recommendations: keep next appointment as scheduled    Kristeen Miss Johsua Shevlin  02/08/2022 11:30 AM

## 2022-02-08 NOTE — Telephone Encounter (Signed)
Preadmission screen  

## 2022-02-09 ENCOUNTER — Encounter (HOSPITAL_COMMUNITY): Payer: Self-pay | Admitting: *Deleted

## 2022-02-09 ENCOUNTER — Telehealth (HOSPITAL_COMMUNITY): Payer: Self-pay | Admitting: *Deleted

## 2022-02-09 LAB — CULTURE, BETA STREP (GROUP B ONLY): Strep Gp B Culture: POSITIVE — AB

## 2022-02-09 NOTE — Telephone Encounter (Signed)
Preadmission screen  

## 2022-02-09 NOTE — Addendum Note (Signed)
Addended by: Florian Buff on: 02/09/2022 07:14 PM   Modules accepted: Orders

## 2022-02-09 NOTE — Treatment Plan (Signed)
   Induction Assessment Scheduling Form: Fax to [facility]:  [phone]  Monica Kramer                                                                                   DOB:  [birth year]                                                            MRN:  [id number]                                                                     Phone #:   [phone]                         Provider:  [facility]  GP:  G1P0000                                                            Estimated Date of Delivery: 03/06/22  Dating Criteria: early sonogram    Medical Indications for induction:  severe FGR with elevated UAD Admission Date/Time:  02/12/22 @2345  Gestational age on admission:  [redacted]w[redacted]d   Filed Weights   02/06/22 1420  Weight: 154 lb (69.9 kg)   HIV:  Non Reactive (08/28 1553) GBS: Positive/-- (10/17 1631)  Cervix not examined   Method of induction(proposed):  choice   Scheduling Provider Signature:  Florian Buff, MD                                            Today's Date:  02/09/2022

## 2022-02-12 ENCOUNTER — Other Ambulatory Visit: Payer: Medicaid Other

## 2022-02-12 ENCOUNTER — Inpatient Hospital Stay (HOSPITAL_COMMUNITY)
Admission: AD | Admit: 2022-02-12 | Discharge: 2022-02-15 | DRG: 806 | Disposition: A | Discharge status: Home / Self Care | Payer: Medicaid Other | Attending: Obstetrics and Gynecology | Admitting: Obstetrics and Gynecology

## 2022-02-12 ENCOUNTER — Other Ambulatory Visit: Payer: Self-pay

## 2022-02-12 ENCOUNTER — Encounter: Payer: Medicaid Other | Admitting: Obstetrics & Gynecology

## 2022-02-12 DIAGNOSIS — Z3A37 37 weeks gestation of pregnancy: Secondary | ICD-10-CM

## 2022-02-12 DIAGNOSIS — O9822 Gonorrhea complicating childbirth: Secondary | ICD-10-CM | POA: Diagnosis not present

## 2022-02-12 DIAGNOSIS — O0990 Supervision of high risk pregnancy, unspecified, unspecified trimester: Secondary | ICD-10-CM

## 2022-02-12 DIAGNOSIS — O9832 Other infections with a predominantly sexual mode of transmission complicating childbirth: Secondary | ICD-10-CM | POA: Diagnosis not present

## 2022-02-12 DIAGNOSIS — O365930 Maternal care for other known or suspected poor fetal growth, third trimester, not applicable or unspecified: Principal | ICD-10-CM | POA: Diagnosis present

## 2022-02-12 DIAGNOSIS — O99824 Streptococcus B carrier state complicating childbirth: Secondary | ICD-10-CM | POA: Diagnosis present

## 2022-02-12 DIAGNOSIS — A549 Gonococcal infection, unspecified: Secondary | ICD-10-CM | POA: Diagnosis present

## 2022-02-12 DIAGNOSIS — A568 Sexually transmitted chlamydial infection of other sites: Secondary | ICD-10-CM | POA: Diagnosis not present

## 2022-02-12 DIAGNOSIS — O98219 Gonorrhea complicating pregnancy, unspecified trimester: Secondary | ICD-10-CM | POA: Diagnosis present

## 2022-02-12 DIAGNOSIS — R8271 Bacteriuria: Secondary | ICD-10-CM | POA: Diagnosis present

## 2022-02-12 DIAGNOSIS — A749 Chlamydial infection, unspecified: Secondary | ICD-10-CM | POA: Diagnosis present

## 2022-02-12 DIAGNOSIS — O99820 Streptococcus B carrier state complicating pregnancy: Secondary | ICD-10-CM | POA: Diagnosis not present

## 2022-02-12 DIAGNOSIS — O365990 Maternal care for other known or suspected poor fetal growth, unspecified trimester, not applicable or unspecified: Secondary | ICD-10-CM | POA: Diagnosis present

## 2022-02-13 ENCOUNTER — Encounter (HOSPITAL_COMMUNITY): Payer: Self-pay | Admitting: Obstetrics and Gynecology

## 2022-02-13 ENCOUNTER — Other Ambulatory Visit: Payer: Self-pay

## 2022-02-13 ENCOUNTER — Inpatient Hospital Stay (HOSPITAL_COMMUNITY): Payer: Medicaid Other

## 2022-02-13 ENCOUNTER — Inpatient Hospital Stay (HOSPITAL_COMMUNITY): Payer: Medicaid Other | Admitting: Anesthesiology

## 2022-02-13 DIAGNOSIS — O98219 Gonorrhea complicating pregnancy, unspecified trimester: Secondary | ICD-10-CM | POA: Diagnosis present

## 2022-02-13 DIAGNOSIS — Z3A37 37 weeks gestation of pregnancy: Secondary | ICD-10-CM | POA: Diagnosis not present

## 2022-02-13 DIAGNOSIS — O365930 Maternal care for other known or suspected poor fetal growth, third trimester, not applicable or unspecified: Secondary | ICD-10-CM | POA: Diagnosis not present

## 2022-02-13 DIAGNOSIS — O99820 Streptococcus B carrier state complicating pregnancy: Secondary | ICD-10-CM | POA: Diagnosis not present

## 2022-02-13 DIAGNOSIS — O9822 Gonorrhea complicating childbirth: Secondary | ICD-10-CM | POA: Diagnosis not present

## 2022-02-13 DIAGNOSIS — O365990 Maternal care for other known or suspected poor fetal growth, unspecified trimester, not applicable or unspecified: Secondary | ICD-10-CM | POA: Diagnosis not present

## 2022-02-13 LAB — CBC
HCT: 35.3 % — ABNORMAL LOW (ref 36.0–46.0)
Hemoglobin: 12.5 g/dL (ref 12.0–15.0)
MCH: 31.2 pg (ref 26.0–34.0)
MCHC: 35.4 g/dL (ref 30.0–36.0)
MCV: 88 fL (ref 80.0–100.0)
Platelets: 285 10*3/uL (ref 150–400)
RBC: 4.01 MIL/uL (ref 3.87–5.11)
RDW: 13.4 % (ref 11.5–15.5)
WBC: 9.9 10*3/uL (ref 4.0–10.5)
nRBC: 0 % (ref 0.0–0.2)

## 2022-02-13 LAB — RAPID HIV SCREEN (HIV 1/2 AB+AG)
HIV 1/2 Antibodies: NONREACTIVE
HIV-1 P24 Antigen - HIV24: NONREACTIVE

## 2022-02-13 LAB — TYPE AND SCREEN
ABO/RH(D): O POS
Antibody Screen: NEGATIVE

## 2022-02-13 LAB — RPR: RPR Ser Ql: NONREACTIVE

## 2022-02-13 MED ORDER — MEASLES, MUMPS & RUBELLA VAC IJ SOLR: 0.5000 mL | Freq: Once | INTRAMUSCULAR | Status: DC

## 2022-02-13 MED ORDER — FENTANYL CITRATE (PF) 100 MCG/2ML IJ SOLN
50.0000 ug | INTRAMUSCULAR | Status: DC | PRN
  Administered 2022-02-13 (×3): 100 ug via INTRAVENOUS
  Filled 2022-02-13 (×3): qty 2

## 2022-02-13 MED ORDER — WITCH HAZEL-GLYCERIN EX PADS: 1.0000 | MEDICATED_PAD | CUTANEOUS | Status: DC | PRN

## 2022-02-13 MED ORDER — HYDROXYZINE HCL 25 MG PO TABS
25.0000 mg | ORAL_TABLET | Freq: Four times a day (QID) | ORAL | Status: DC | PRN
  Administered 2022-02-13: 25 mg via ORAL
  Filled 2022-02-13: qty 1

## 2022-02-13 MED ORDER — DIPHENHYDRAMINE HCL 50 MG/ML IJ SOLN: 12.5000 mg | INTRAMUSCULAR | Status: DC | PRN

## 2022-02-13 MED ORDER — SIMETHICONE 80 MG PO CHEW: 80.0000 mg | CHEWABLE_TABLET | ORAL | Status: DC | PRN

## 2022-02-13 MED ORDER — ONDANSETRON HCL 4 MG/2ML IJ SOLN: 4.0000 mg | INTRAMUSCULAR | Status: DC | PRN

## 2022-02-13 MED ORDER — LACTATED RINGERS IV SOLN: INTRAVENOUS | Status: DC

## 2022-02-13 MED ORDER — PRENATAL MULTIVITAMIN CH
1.0000 | ORAL_TABLET | Freq: Every day | ORAL | Status: DC
  Administered 2022-02-14: 1 via ORAL
  Filled 2022-02-13: qty 1

## 2022-02-13 MED ORDER — BENZOCAINE-MENTHOL 20-0.5 % EX AERO
1.0000 | INHALATION_SPRAY | CUTANEOUS | Status: DC | PRN
  Administered 2022-02-14: 1 via TOPICAL
  Filled 2022-02-13: qty 56

## 2022-02-13 MED ORDER — ONDANSETRON HCL 4 MG/2ML IJ SOLN
4.0000 mg | Freq: Four times a day (QID) | INTRAMUSCULAR | Status: DC | PRN
  Administered 2022-02-13: 4 mg via INTRAVENOUS
  Filled 2022-02-13: qty 2

## 2022-02-13 MED ORDER — COCONUT OIL OIL: 1.0000 | TOPICAL_OIL | Status: DC | PRN

## 2022-02-13 MED ORDER — DIBUCAINE (PERIANAL) 1 % EX OINT: 1.0000 | TOPICAL_OINTMENT | CUTANEOUS | Status: DC | PRN

## 2022-02-13 MED ORDER — LIDOCAINE HCL (PF) 1 % IJ SOLN
INTRAMUSCULAR | Status: DC | PRN
  Administered 2022-02-13 (×2): 5 mL via EPIDURAL

## 2022-02-13 MED ORDER — PHENYLEPHRINE 80 MCG/ML (10ML) SYRINGE FOR IV PUSH (FOR BLOOD PRESSURE SUPPORT): 80.0000 ug | PREFILLED_SYRINGE | INTRAVENOUS | Status: DC | PRN

## 2022-02-13 MED ORDER — CEFTRIAXONE SODIUM 500 MG IJ SOLR
500.0000 mg | Freq: Once | INTRAMUSCULAR | Status: AC
  Administered 2022-02-13: 500 mg via INTRAMUSCULAR
  Filled 2022-02-13: qty 500

## 2022-02-13 MED ORDER — OXYCODONE-ACETAMINOPHEN 5-325 MG PO TABS: 1.0000 | ORAL_TABLET | ORAL | Status: DC | PRN

## 2022-02-13 MED ORDER — LIDOCAINE HCL (PF) 1 % IJ SOLN
30.0000 mL | INTRAMUSCULAR | Status: DC | PRN
  Filled 2022-02-13: qty 30

## 2022-02-13 MED ORDER — OXYCODONE-ACETAMINOPHEN 5-325 MG PO TABS: 2.0000 | ORAL_TABLET | ORAL | Status: DC | PRN

## 2022-02-13 MED ORDER — TERBUTALINE SULFATE 1 MG/ML IJ SOLN: 0.2500 mg | Freq: Once | INTRAMUSCULAR | Status: DC | PRN

## 2022-02-13 MED ORDER — OXYTOCIN-SODIUM CHLORIDE 30-0.9 UT/500ML-% IV SOLN
1.0000 m[IU]/min | INTRAVENOUS | Status: DC
  Filled 2022-02-13: qty 500

## 2022-02-13 MED ORDER — SODIUM CHLORIDE 0.9 % IV SOLN
5.0000 10*6.[IU] | Freq: Once | INTRAVENOUS | Status: AC
  Administered 2022-02-13: 5 10*6.[IU] via INTRAVENOUS
  Filled 2022-02-13: qty 5

## 2022-02-13 MED ORDER — EPHEDRINE 5 MG/ML INJ: 10.0000 mg | INTRAVENOUS | Status: DC | PRN

## 2022-02-13 MED ORDER — TETANUS-DIPHTH-ACELL PERTUSSIS 5-2.5-18.5 LF-MCG/0.5 IM SUSY: 0.5000 mL | PREFILLED_SYRINGE | Freq: Once | INTRAMUSCULAR | Status: DC

## 2022-02-13 MED ORDER — FENTANYL-BUPIVACAINE-NACL 0.5-0.125-0.9 MG/250ML-% EP SOLN
12.0000 mL/h | EPIDURAL | Status: DC | PRN
  Administered 2022-02-13: 12 mL/h via EPIDURAL
  Filled 2022-02-13: qty 250

## 2022-02-13 MED ORDER — ONDANSETRON HCL 4 MG PO TABS: 4.0000 mg | ORAL_TABLET | ORAL | Status: DC | PRN

## 2022-02-13 MED ORDER — OXYTOCIN BOLUS FROM INFUSION
333.0000 mL | Freq: Once | INTRAVENOUS | Status: AC
  Administered 2022-02-13: 333 mL via INTRAVENOUS

## 2022-02-13 MED ORDER — OXYTOCIN-SODIUM CHLORIDE 30-0.9 UT/500ML-% IV SOLN
2.5000 [IU]/h | INTRAVENOUS | Status: DC
  Administered 2022-02-13: 2.5 [IU]/h via INTRAVENOUS

## 2022-02-13 MED ORDER — DIPHENHYDRAMINE HCL 25 MG PO CAPS: 25.0000 mg | ORAL_CAPSULE | Freq: Four times a day (QID) | ORAL | Status: DC | PRN

## 2022-02-13 MED ORDER — MISOPROSTOL 25 MCG QUARTER TABLET
25.0000 ug | ORAL_TABLET | ORAL | Status: DC | PRN
  Administered 2022-02-13 (×3): 25 ug via VAGINAL
  Filled 2022-02-13 (×3): qty 1

## 2022-02-13 MED ORDER — SOD CITRATE-CITRIC ACID 500-334 MG/5ML PO SOLN: 30.0000 mL | ORAL | Status: DC | PRN

## 2022-02-13 MED ORDER — PENICILLIN G POT IN DEXTROSE 60000 UNIT/ML IV SOLN
3.0000 10*6.[IU] | INTRAVENOUS | Status: DC
  Administered 2022-02-13 (×2): 3 10*6.[IU] via INTRAVENOUS
  Filled 2022-02-13 (×2): qty 50

## 2022-02-13 MED ORDER — LACTATED RINGERS IV SOLN: 500.0000 mL | INTRAVENOUS | Status: DC | PRN

## 2022-02-13 MED ORDER — AZITHROMYCIN 500 MG PO TABS
1000.0000 mg | ORAL_TABLET | Freq: Once | ORAL | Status: AC
  Administered 2022-02-13: 1000 mg via ORAL
  Filled 2022-02-13: qty 2

## 2022-02-13 MED ORDER — ACETAMINOPHEN 325 MG PO TABS: 650.0000 mg | ORAL_TABLET | ORAL | Status: DC | PRN

## 2022-02-13 MED ORDER — MAGNESIUM HYDROXIDE 400 MG/5ML PO SUSP: 30.0000 mL | ORAL | Status: DC | PRN

## 2022-02-13 MED ORDER — IBUPROFEN 600 MG PO TABS
600.0000 mg | ORAL_TABLET | Freq: Four times a day (QID) | ORAL | Status: DC
  Administered 2022-02-13 – 2022-02-15 (×7): 600 mg via ORAL
  Filled 2022-02-13 (×7): qty 1

## 2022-02-13 MED ORDER — LACTATED RINGERS IV SOLN: 500.0000 mL | Freq: Once | INTRAVENOUS | Status: DC

## 2022-02-13 NOTE — H&P (Signed)
OBSTETRIC ADMISSION HISTORY AND PHYSICAL  Monica Kramer is a 21 y.o. female G1P0000 with IUP at [redacted]w[redacted]d by 11w ultrasound presenting for IOL s/t severe FGR. She reports +FMs, No LOF, no VB, no blurry vision, headaches or peripheral edema, and RUQ pain.  She plans on breast feeding. She request depo for birth control. She received her prenatal care at [facility]   Dating: By 11w ultrasound --->  Estimated Date of Delivery: 03/06/22  Sono:    @[redacted]w[redacted]d , CWD, normal anatomy, cephalic presentation, posterior lie, 1562g, 0.16% EFW Elevated dopplers  Prenatal History/Complications:   Severe FGR < 1% Bilateral EICF, stomach bubble and echogenic bowel --> all resolved  + Chlamydia and Gonorrhea  GBS+    Past Medical History: Past Medical History:  Diagnosis Date   Medical history non-contributory    Seasonal allergies     Past Surgical History: History reviewed. No pertinent surgical history.  Obstetrical History: OB History     Gravida  1   Para  0   Term  0   Preterm  0   AB  0   Living  0      SAB  0   IAB  0   Ectopic  0   Multiple  0   Live Births  0           Social History Social History   Socioeconomic History   Marital status: Single    Spouse name: Not on file   Number of children: Not on file   Years of education: Not on file   Highest education level: Not on file  Occupational History   Not on file  Tobacco Use   Smoking status: Never   Smokeless tobacco: Never  Vaping Use   Vaping Use: Former  Substance and Sexual Activity   Alcohol use: Never    Alcohol/week: 0.0 standard drinks of alcohol   Drug use: Not Currently    Types: Marijuana   Sexual activity: Yes    Birth control/protection: None  Other Topics Concern   Not on file  Social History Narrative   Lives with paternal GM and cousin   Social Determinants of Health   Financial Resource Strain: Low Risk  (09/20/2021)   Overall Financial Resource Strain (CARDIA)     Difficulty of Paying Living Expenses: Not hard at all  Food Insecurity: No Food Insecurity (02/13/2022)   Hunger Vital Sign    Worried About Running Out of Food in the Last Year: Never true    Ran Out of Food in the Last Year: Never true  Transportation Needs: No Transportation Needs (02/13/2022)   PRAPARE - Hydrologist (Medical): No    Lack of Transportation (Non-Medical): No  Physical Activity: Sufficiently Active (09/20/2021)   Exercise Vital Sign    Days of Exercise per Week: 6 days    Minutes of Exercise per Session: 40 min  Stress: No Stress Concern Present (09/20/2021)   [place]    Feeling of Stress : Only a little  Social Connections: Socially Isolated (09/20/2021)   Social Connection and Isolation Panel [NHANES]    Frequency of Communication with Friends and Family: Never    Frequency of Social Gatherings with Friends and Family: Never    Attends Religious Services: Never    Marine scientist or Organizations: No    Attends Archivist Meetings: Never    Marital Status: Never  married    Family History: Family History  Problem Relation Age of Onset   Hypertension Mother    Hypertension Father    Birth defects Paternal Uncle    Cancer Maternal Grandmother        breaat   Hypertension Paternal Grandmother     Allergies: Allergies  Allergen Reactions   Tomato Hives    Medications Prior to Admission  Medication Sig Dispense Refill Last Dose   Prenatal MV & Min w/FA-DHA (PRENATAL GUMMIES PO) Take by mouth.   02/12/2022   Blood Pressure Monitor MISC For regular home bp monitoring during pregnancy 1 each 0      Review of Systems   All systems reviewed and negative except as stated in HPI  Blood pressure 131/74, pulse 80, temperature 98.1 F (36.7 C), temperature source Oral, resp. rate 17, last menstrual period 06/07/2021. General appearance: alert,  nervous Lungs: clear to auscultation bilaterally Heart: regular rate and rhythm Abdomen: soft, non-tender; bowel sounds normal Extremities: Homans sign is negative, no sign of DVT Presentation: cephalic, by ultrasound  Fetal monitoring Baseline 135, moderate variability, + accels, - decels Uterine activity No contractions  SVE closed/thick/high      Prenatal labs: ABO, Rh: --/--/PENDING (10/24 0107) Antibody: PENDING (10/24 0107) Rubella: 1.31 (08/17 1102) RPR: Non Reactive (08/17 1102)  HBsAg: Negative (05/31 1446)  HIV: Non Reactive (08/28 1553)  GBS: Positive/-- (10/17 1631)  1 hr Glucola did not have, A1c 5.4 Genetic screening  low risk female  Anatomy US 01/27/2022 wnl   Prenatal Transfer Tool  Maternal Diabetes: No Genetic Screening: Normal Maternal Ultrasounds/Referrals: IUGR, Bilateral EICF, stomach bubble and echogenic bowel --> all resolved  Fetal Ultrasounds or other Referrals:  Referred to Materal Fetal Medicine  Maternal Substance Abuse:  No Significant Maternal Medications:  None Significant Maternal Lab Results:  Group B Strep negative and Other: + Chlamydia, Gonorrhea  Number of Prenatal Visits:greater than 3 verified prenatal visits Other Comments:  None  Results for orders placed or performed during the hospital encounter of 02/12/22 (from the past 24 hour(s))  CBC   Collection Time: 02/13/22  1:07 AM  Result Value Ref Range   WBC 9.9 4.0 - 10.5 K/uL   RBC 4.01 3.87 - 5.11 MIL/uL   Hemoglobin 12.5 12.0 - 15.0 g/dL   HCT 35.3 (L) 36.0 - 46.0 %   MCV 88.0 80.0 - 100.0 fL   MCH 31.2 26.0 - 34.0 pg   MCHC 35.4 30.0 - 36.0 g/dL   RDW 13.4 11.5 - 15.5 %   Platelets 285 150 - 400 K/uL   nRBC 0.0 0.0 - 0.2 %  Type and screen   Collection Time: 02/13/22  1:07 AM  Result Value Ref Range   ABO/RH(D) PENDING    Antibody Screen PENDING    Sample Expiration      02/16/2022,2359 Performed at [facility], [place] [address], [place], [place] [address]      Patient Active Problem List   Diagnosis Date Noted   Fetal growth restriction antepartum 12/07/2021   GBS bacteriuria 09/22/2021   Supervision of high-risk pregnancy 08/23/2021    Assessment/Plan:  Monica Kramer is a 21 y.o. G1P0000 at [redacted]w[redacted]d here for IOL for severe FGR < 1%, and UAD.   #Labor: Vaginal cytotec, continue to monitor  #Pain: Plan for IV fentanyl and eventual epidural  #FWB: Cat 1  #ID:  GBS + --> PCN, Chlamydia and Gonorrhea + --> azithro and ancef  #MOF: Breast #  TOI:ZTIW  #Circ:  N/a  Stormy Card, MD  02/13/2022, 1:39 AM

## 2022-02-13 NOTE — Anesthesia Preprocedure Evaluation (Signed)
Anesthesia Evaluation  Patient identified by MRN, date of birth, ID band Patient awake    Reviewed: Allergy & Precautions, Patient's Chart, lab work & pertinent test results  Airway Mallampati: II  TM Distance: >3 FB Neck ROM: Full    Dental no notable dental hx. (+) Teeth Intact   Pulmonary neg pulmonary ROS,    Pulmonary exam normal breath sounds clear to auscultation       Cardiovascular negative cardio ROS Normal cardiovascular exam Rhythm:Regular Rate:Normal     Neuro/Psych negative neurological ROS  negative psych ROS   GI/Hepatic Neg liver ROS, GERD  ,  Endo/Other  negative endocrine ROS  Renal/GU negative Renal ROS  negative genitourinary   Musculoskeletal negative musculoskeletal ROS (+)   Abdominal   Peds  Hematology negative hematology ROS (+)   Anesthesia Other Findings   Reproductive/Obstetrics (+) Pregnancy                             Anesthesia Physical Anesthesia Plan  ASA: 2  Anesthesia Plan:    Post-op Pain Management:    Induction: Intravenous  PONV Risk Score and Plan:   Airway Management Planned: Natural Airway  Additional Equipment:   Intra-op Plan:   Post-operative Plan:   Informed Consent: I have reviewed the patients History and Physical, chart, labs and discussed the procedure including the risks, benefits and alternatives for the proposed anesthesia with the patient or authorized representative who has indicated his/her understanding and acceptance.       Plan Discussed with: Anesthesiologist  Anesthesia Plan Comments:         Anesthesia Quick Evaluation

## 2022-02-13 NOTE — Anesthesia Procedure Notes (Signed)
Epidural Patient location during procedure: OB Start time: 02/13/2022 1:39 PM End time: 02/13/2022 1:47 PM  Staffing Anesthesiologist: Josephine Igo, MD Performed: anesthesiologist   Preanesthetic Checklist Completed: patient identified, IV checked, site marked, risks and benefits discussed, surgical consent, monitors and equipment checked, pre-op evaluation and timeout performed  Epidural Patient position: sitting Prep: DuraPrep and site prepped and draped Patient monitoring: continuous pulse ox and blood pressure Approach: midline Location: L3-L4 Injection technique: LOR air  Needle:  Needle type: Tuohy  Needle gauge: 17 G Needle length: 9 cm and 9 Needle insertion depth: 7 cm Catheter type: closed end flexible Catheter size: 19 Gauge Catheter at skin depth: 12 cm Test dose: negative and Other  Assessment Events: blood not aspirated, injection not painful, no injection resistance, no paresthesia and negative IV test  Additional Notes Patient identified. Risks and benefits discussed including failed block, incomplete  Pain control, post dural puncture headache, nerve damage, paralysis, blood pressure Changes, nausea, vomiting, reactions to medications-both toxic and allergic and post Partum back pain. All questions were answered. Patient expressed understanding and wished to proceed. Sterile technique was used throughout procedure. Epidural site was Dressed with sterile barrier dressing. No paresthesias, signs of intravascular injection Or signs of intrathecal spread were encountered.  Patient was more comfortable after the epidural was dosed. Please see RN's note for documentation of vital signs and FHR which are stable. Reason for block:procedure for pain

## 2022-02-13 NOTE — Lactation Note (Signed)
This note was copied from a baby's chart.  NICU Lactation Consultation Note  Patient Name: Monica Kramer [id number] Date: 02/13/2022 Age:20 hours  Subjective Reason for consult: Initial assessment; Other (Comment); NICU baby; 1st time breastfeeding; Primapara; Early term 48-38.6wks (SGA, IUGR)  Visited with family of 20 hour old ETI NICU female, Ms. Deam a P1 and reports that she's planning on providing breastmilk to her baby, she had some questions regarding donor milk Vs. formula. Set up a DEBP, Ms. Reach would like to defer hand expression education at this point, she just started having her dinner. Reviewed lactogenesis II, pumping schedule, benefits of breastmilk/donor milk and anticipatory guidelines.  Objective Infant data: Mother's Current Feeding Choice: Breast Milk and Donor Milk  Infant feeding assessment Scale for Readiness: 3  Maternal data: G1P1001  Vaginal, Spontaneous Significant Breast History:: moderate breast changes Current breast feeding challenges:: NICU admission Does the patient have breastfeeding experience prior to this delivery?: No Pumping frequency: q 3 hours (recommended) Flange Size: 24 Risk factor for low milk supply:: primipara, infant separation WIC Program: Yes WIC Referral Sent?: Yes [place]  Assessment Infant: In NICU  Maternal: Breasts are soft  Intervention/Plan Interventions: Breast feeding basics reviewed; DEBP; Education; [employer] brochure Tools: Pump; Flanges Pump Education: Setup, frequency, and cleaning; Milk Storage  Plan of care: Encouraged pumping every 3 hours, ideally 8 pumping sessions in 24 hours LC to show patient how to hand express tomorrow  Great GOB and mom's uncle present and very supportive. All questions and concerns answered, family to contact [facility] services PRN.  Consult Status: NICU follow-up  NICU Follow-up type: New admission follow up; Maternal D/C visit; Verify onset of copious  milk; Verify absence of engorgement   Mithran Strike S Marthe Dant 02/13/2022, 7:09 PM

## 2022-02-14 LAB — CBC
HCT: 34.3 % — ABNORMAL LOW (ref 36.0–46.0)
Hemoglobin: 11.9 g/dL — ABNORMAL LOW (ref 12.0–15.0)
MCH: 31.2 pg (ref 26.0–34.0)
MCHC: 34.7 g/dL (ref 30.0–36.0)
MCV: 90 fL (ref 80.0–100.0)
Platelets: 231 10*3/uL (ref 150–400)
RBC: 3.81 MIL/uL — ABNORMAL LOW (ref 3.87–5.11)
RDW: 13.9 % (ref 11.5–15.5)
WBC: 9.8 10*3/uL (ref 4.0–10.5)
nRBC: 0 % (ref 0.0–0.2)

## 2022-02-14 MED ORDER — AZITHROMYCIN 500 MG PO TABS
1000.0000 mg | ORAL_TABLET | Freq: Once | ORAL | Status: AC
  Administered 2022-02-14: 1000 mg via ORAL
  Filled 2022-02-14: qty 2

## 2022-02-14 NOTE — Progress Notes (Addendum)
POSTPARTUM PROGRESS NOTE  Post Partum Day 1  Subjective:  Monica Kramer is a 21 y.o. G1P1001 s/p VD at [redacted]w[redacted]d.  She reports she is doing well. No acute events overnight. She denies any problems with ambulating, voiding or po intake. Denies nausea or vomiting, RUQ pain, HA, vision changes, CP, SOB, lightheadedness. Pain is well controlled.  Lochia is improving.  Objective: Blood pressure 119/79, pulse (!) 58, temperature 98 F (36.7 C), temperature source Oral, resp. rate 16, height 5\' 3"  (1.6 m), weight 71.1 kg, last menstrual period 06/07/2021, SpO2 100 %, unknown if currently breastfeeding.  Physical Exam:  General: alert, cooperative and no distress Chest: no respiratory distress Heart:regular rate, distal pulses intact Abdomen: soft, nontender,  Uterine Fundus: firm, appropriately tender DVT Evaluation: No calf swelling or tenderness Extremities: No edema Skin: warm, dry  Recent Labs    02/13/22 0107 02/14/22 0619  HGB 12.5 11.9*  HCT 35.3* 34.3*    Assessment/Plan: Monica Kramer is a 21 y.o. G1P1001 s/p VD at [redacted]w[redacted]d   PPD#1 - Doing well  Routine postpartum care  Hb stable: No Sx. Continue prenatal postpartum  Going to visit baby in NICU today Chlamydia and Gonorrhea: S/p IM Ceftriaxone and repeat azithromycin 1g dose x1 since vomited it yesterday.  Contraception: Depo Feeding: Both Dispo: Plan for discharge later today v tomorrow.   LOS: 2 days   Wilhemina Cash, MD PGY-1 Family Medicine Resident [place] 02/14/2022, 7:25 AM ____ GME ATTESTATION:  Evaluation and management procedures were performed by the [facility] Medicine Resident under my supervision. I was immediately available for direct supervision, assistance and direction throughout this encounter.  I also confirm that I have verified the information documented in the resident's note, and that I have also personally reperformed the pertinent components of the physical exam and all of the  medical decision making activities.  I have also made any necessary editorial changes.  Shelda Pal, DO OB Fellow, [place] [facility] [place] 02/14/2022 8:12 AM

## 2022-02-14 NOTE — Lactation Note (Signed)
This note was copied from a baby's chart.  NICU Lactation Consultation Note  Patient Name: Monica Kramer Date: 02/14/2022 Age:20 hours  Subjective Reason for consult: Follow-up assessment; Other (Comment); NICU baby; 1st time breastfeeding; Primapara; Infant < 6lbs; Early term 37-38.6wks (SGA, IUGR)  Visited with family of 44 hours old ETI NICU female, Monica Kramer is a P1 and reports she hasn't pumped since [facility] consult last night. Explained the importance of consistent pumping for the onset of lactogenesis II and to protect her supply. Assisted with this pumping session, noticed she was holding the flanges upside down, after flipping them over, noticed that there was some EBM in it, praised her for her efforts.  Objective Infant data: Mother's Current Feeding Choice: Breast Milk and Donor Milk  Infant feeding assessment Scale for Readiness: 4  Maternal data: G1P1001  Vaginal, Spontaneous Significant Breast History:: moderate breast changes Current breast feeding challenges:: NICU admission Does the patient have breastfeeding experience prior to this delivery?: No Pumping frequency: 1 times/24 hours Pumped volume: 2 mL Flange Size: 24 Risk factor for low milk supply:: primipara, infant separation WIC Program: Yes WIC Referral Sent?: Yes  Assessment Infant: In NICU  Maternal: Milk volume: Normal  Intervention/Plan Interventions: Breast feeding basics reviewed; DEBP; Education Tools: Pump; Flanges Pump Education: Setup, frequency, and cleaning; Milk Storage  Plan of care: Encouraged pumping every 3 hours, ideally 8 pumping sessions in 24 hours or the closest she can get to it She'll start turning her EBM to the NICU   No other support person at this time. All questions and concerns answered, family to contact [facility] services PRN.  Consult Status: NICU follow-up  NICU Follow-up type: Maternal D/C visit; Verify onset of copious milk; Verify absence of  engorgement   Monica Kramer 02/14/2022, 6:05 PM

## 2022-02-14 NOTE — Progress Notes (Signed)
LATE ENTRY  Monica Kramer is a 21 y.o. G1P1001 at [redacted]w[redacted]d.  Subjective: Was very uncomfortable with contractions. Adequately controlled w/ Fentanyl.  Objective: BP 120/75 Pulse 87 FHT:  FHR: 125 bpm, variability: mod,  accelerations:  15x15,  decelerations:  none UC:   Q 2-5 minutes, mod Dilation: 3 Effacement (%): 100 Cervical Position: Anterior Station: -1 Presentation: Vertex Exam by:: lee No HVS lesions  Labs: Results for orders placed or performed during the hospital encounter of 02/12/22 (from the past 24 hour(s))  CBC     Status: Abnormal   Collection Time: 02/14/22  6:19 AM  Result Value Ref Range   WBC 9.8 4.0 - 10.5 K/uL   RBC 3.81 (L) 3.87 - 5.11 MIL/uL   Hemoglobin 11.9 (L) 12.0 - 15.0 g/dL   HCT 34.3 (L) 36.0 - 46.0 %   MCV 90.0 80.0 - 100.0 fL   MCH 31.2 26.0 - 34.0 pg   MCHC 34.7 30.0 - 36.0 g/dL   RDW 13.9 11.5 - 15.5 %   Platelets 231 150 - 400 K/uL   nRBC 0.0 0.0 - 0.2 %    Assessment / Plan: [redacted]w[redacted]d week IUP Labor: Early/IOL Progressing well. Contracting frequently of on Cytotec. Will not start pitocin at this time but may need to if labor/contractions stall.  Fetal Wellbeing:  Category I Pain Control:  Fentanyl. May have epidural PRN.  Anticipated MOD:  SVD  Manya Silvas, [place] 02/14/2022 12:57 PM

## 2022-02-14 NOTE — Anesthesia Postprocedure Evaluation (Signed)
Anesthesia Post Note  Patient: Monica Kramer  Procedure(s) Performed: AN AD [place]     Patient location during evaluation: Mother Baby Anesthesia Type: Epidural Level of consciousness: awake and alert Pain management: pain level controlled Vital Signs Assessment: post-procedure vital signs reviewed and stable Respiratory status: spontaneous breathing, nonlabored ventilation and respiratory function stable Cardiovascular status: stable Postop Assessment: no headache, no backache and epidural receding Anesthetic complications: no   No notable events documented.  Last Vitals:  Vitals:   02/14/22 0223 02/14/22 0535  BP: 107/60 119/79  Pulse: 70 (!) 58  Resp: 16 16  Temp: 36.8 C 36.7 C  SpO2: 100% 100%    Last Pain:  Vitals:   02/14/22 0814  TempSrc:   PainSc: 0-No pain   Pain Goal:                Epidural/Spinal Function Cutaneous sensation: Normal sensation (02/14/22 0814), Patient able to flex knees: Yes (02/14/22 0814), Patient able to lift hips off bed: Yes (02/14/22 0814), Back pain beyond tenderness at insertion site: No (02/14/22 0814), Progressively worsening motor and/or sensory loss: No (02/14/22 0814), Bowel and/or bladder incontinence post epidural: No (02/14/22 2831)  Barkley Boards

## 2022-02-14 NOTE — Clinical Social Work Maternal (Signed)
CLINICAL SOCIAL WORK MATERNAL/CHILD NOTE  Patient Details  Name: Monica Kramer MRN: [id number] Date of Birth: Feb 03, 2001  Date:  02/14/2022  Clinical Social Worker Initiating Note:  Abundio Miu, [place] Date/Time: Initiated:  02/14/22/1241     Child's Name:  Monica Kramer   Biological Parents:  Mother, Father (Father: Monica Kramer)   Need for Interpreter:  None   Reason for Referral:  Parental Support of Premature Babies < 6 weeks/or Critically Ill babies, Other (Comment) (Poor Social Support; Anxiety)   Address:  [place] [address]    Phone number:  [phone] (home)     Additional phone number:   Household Members/Support Persons (HM/SP):   Household Member/Support Person 1   HM/SP Name Relationship DOB or Age  HM/SP -[place] grandmother    HM/SP -2        HM/SP -3        HM/SP -4        HM/SP -5        HM/SP -6        HM/SP -7        HM/SP -8          Natural Supports (not living in the home):      Professional Supports: None   Employment: Unemployed   Type of Work:     Education:  [place] arranged:    Museum/gallery curator Resources:  [employer]   Other Resources:  [employer], Physicist, medical     Cultural/Religious Considerations Which May Impact Care:    Strengths:  Ability to meet basic needs  , Pediatrician chosen   Psychotropic Medications:         Pediatrician:    [employer]  Pediatrician List:   [place] Other ([facility])  [facility]      Pediatrician Fax Number:    Risk Factors/Current Problems:  Substance Use     Cognitive State:  Able to Concentrate  , Alert  , Goal Oriented  , Linear Thinking     Mood/Affect:  Comfortable  , Calm  , Happy  , Interested  , Relaxed     CSW Assessment: CSW met with MOB at bedside to complete psychosocial assessment, MOB's grandmother and a female  guest present. CSW introduced self and asked to speak with MOB privately, MOB's grandmother reported that they were about to leave after ordering MOB's food. MOB's guests left the room. CSW explained reason for consult. MOB was welcoming, pleasant, open, and remained engaged during assessment. MOB reported that she resides with her grandmother and receives both [facility] and food stamps. MOB reported that she has all items needed to care for infant including a car seat. MOB shared that her cousin got her a crib. CSW inquired about MOB's support system, MOB reported that her grandmother is a support.   CSW inquired about MOB's mental health history. MOB denied any mental health history. CSW inquired about anxiety mentioned in consult. MOB denied an anxiety diagnosis. MOB shared that she gets anxious at times but it does not impact her everyday living and does not require any treatment. MOB was not able to identify any triggers that prompt anxiety symptoms. CSW inquired about how MOB was feeling emotionally since giving birth, MOB reported that she was feeling happy. MOB presented calm and did not demonstrate any acute mental  health signs/symptoms. CSW assessed for safety, MOB denied SI, HI, and domestic violence. CSW acknowledged incident that occurred which resulted in FOB being banned. MOB explained that it was an issue between FOB and the RN. MOB shared that FOB was upset with MOB for not saying anything when he was arguing with RN. MOB explained that it was not her place to intervene. MOB denied any safety concerns regarding FOB.   CSW provided education regarding the baby blues period vs. perinatal mood disorders, discussed treatment and gave resources for mental health follow up if concerns arise.  CSW recommends self-evaluation during the postpartum time period using the New Mom Checklist from Postpartum Progress and encouraged MOB to contact a medical professional if symptoms are noted at any time.    CSW  provided review of Sudden Infant Death Syndrome (SIDS) precautions.    CSW and MOB discussed infant's NICU admission. CSW informed MOB about the NICU, what to expect, and resources/supports available while infant is admitted to the NICU. MOB reported that she feels well informed about infant's care. MOB denied any transportation barriers with visiting infant in the NICU. MOB denied any questions/concerns regarding the NICU. CSW informed MOB that infant qualifies to apply for SSI benefits, MOB reported that she was interested. CSW explained SSI benefits and application process. CSW provided MOB with information on the application process.   CSW informed MOB about hospital drug screen policy due to documented substance use during pregnancy. MOB confirmed marijuana use prior to finding out about pregnancy. MOB denied any additional substance use during pregnancy. CSW informed MOB that infant's UDS and CDS would be monitored and a CPS report would be made if warranted. MOB verbalized understanding and denied any questions.   CSW inquired about any additional needs for resources/supports, MOB declined needing any additional resources/supports.   CSW will continue to offer resources/supports while infant is admitted to the NICU as MOB opted for CSW to check in weekly.    CSW Plan/Description:  Sudden Infant Death Syndrome (SIDS) Education, Perinatal Mood and Anxiety Disorder (PMADs) Education, Psychosocial Support and Ongoing Assessment of Needs, Hospital Drug Screen Policy Information, CSW Will Continue to Monitor Umbilical Cord Tissue Drug Screen Results and Make Report if Warranted, [employer] Income (SSI) Information, Other Patient/Family Education    Burnis Medin, [place] 02/14/2022, 12:46 PM

## 2022-02-14 NOTE — Progress Notes (Signed)
POSTPARTUM PROGRESS NOTE  Post Partum Day 20  Subjective:  Monica Kramer is a 21 y.o. G1P1001 s/p VD at [redacted]w[redacted]d.  She reports she is doing well. No acute events overnight. She denies any problems with ambulating, voiding or po intake. Denies nausea or vomiting.  Pain is well controlled.  Lochia is adeqaute.  Objective: Blood pressure 119/79, pulse (!) 58, temperature 98 F (36.7 C), temperature source Oral, resp. rate 16, height 5\' 3"  (1.6 m), weight 71.1 kg, last menstrual period 06/07/2021, SpO2 100 %, unknown if currently breastfeeding.  Physical Exam:  General: alert, cooperative and no distress Chest: no respiratory distress Heart:regular rate, distal pulses intact Uterine Fundus: firm, appropriately tender DVT Evaluation: No calf swelling or tenderness Extremities: mild edema Skin: warm, dry  Recent Labs    02/13/22 0107 02/14/22 0619  HGB 12.5 11.9*  HCT 35.3* 34.3*    Assessment/Plan: Monica Kramer is a 21 y.o. G1P1001 s/p VD at [redacted]w[redacted]d   PPD#1 - Doing well  Routine postpartum care  Hgb stable  Baby in NICU Contraception: Depo Feeding: Breast and bottle Dispo: Plan for discharge later today vs tomorrow.   LOS: 2 days   Shelda Pal, DO OB Fellow  02/14/2022, 7:34 AM

## 2022-02-15 ENCOUNTER — Other Ambulatory Visit (HOSPITAL_COMMUNITY): Payer: Self-pay

## 2022-02-15 ENCOUNTER — Other Ambulatory Visit: Payer: Medicaid Other

## 2022-02-15 LAB — SURGICAL PATHOLOGY

## 2022-02-15 MED ORDER — IBUPROFEN 600 MG PO TABS
600.0000 mg | ORAL_TABLET | Freq: Four times a day (QID) | ORAL | 0 refills | Status: DC
  Filled 2022-02-15: qty 30, 8d supply, fill #0

## 2022-02-15 MED ORDER — HYDROXYZINE HCL 25 MG PO TABS
25.0000 mg | ORAL_TABLET | Freq: Once | ORAL | Status: DC
  Filled 2022-02-15: qty 1

## 2022-02-15 MED ORDER — ACETAMINOPHEN 500 MG PO TABS
500.0000 mg | ORAL_TABLET | Freq: Four times a day (QID) | ORAL | 0 refills | Status: DC | PRN
  Filled 2022-02-15: qty 30, 8d supply, fill #0

## 2022-02-15 NOTE — Discharge Summary (Signed)
Postpartum Discharge Summary  Date of Service updated 10/26     Patient Name: Monica Kramer DOB: [birth year] MRN: [id number]  Date of admission: 02/12/2022 Delivery date:02/13/2022  Delivering provider: Manya Silvas  Date of discharge: 02/13/2022  Admitting diagnosis: Fetal growth restriction antepartum [O36.5990] Intrauterine pregnancy: [redacted]w[redacted]d    Secondary diagnosis:  Principal Problem:   Fetal growth restriction antepartum Active Problems:   Chlamydia infection affecting pregnancy   Vaginal delivery   Gonorrhea affecting pregnancy  Additional problems: Complicated social situation    Discharge diagnosis: Term Pregnancy Delivered                                              Post partum procedures:n/a Augmentation: Cytotec Complications: None  Hospital course: Induction of Labor With Vaginal Delivery   21y.o. yo G1P1001 at 371w0das admitted to the hospital 02/12/2022 for induction of labor.  Indication for induction: fetal growth restriction.  Patient had an labor course complicated by FHR decels.  Membrane Rupture Time/Date: spontaneous Delivery Method:Vaginal, Spontaneous at 1620 10/24 Episiotomy: None  Lacerations:  None  Details of delivery can be found in separate delivery note.  Patient had a postpartum course complicated by GC/CT, treated with CTX and Azithromycin (one time dose). Patient is discharged home 02/15/22.  Newborn Data: Birth date:02/13/2022  Birth time:4:40 PM  Gender:Female  Living status:Living  Apgars:7 ,8  Weight:1650 g  Baby in NICU due to LBW  Magnesium Sulfate received: No BMZ received: No Rhophylac:N/A MMR:N/A T-DaP:Given prenatally Flu: No Transfusion:No  Physical exam  Vitals:   02/13/22 1447 02/13/22 1456 02/13/22 1502 02/13/22 1532  BP:  128/67 118/85 (!) 113/57  Pulse: 78 67 73 67  Resp:      Temp:      TempSrc:      Weight:      Height:       General: alert, cooperative, and no distress Lochia:  appropriate Uterine Fundus: firm Incision: N/A DVT Evaluation: No evidence of DVT seen on physical exam. Labs: Lab Results  Component Value Date   WBC 9.9 02/13/2022   HGB 12.5 02/13/2022   HCT 35.3 (L) 02/13/2022   MCV 88.0 02/13/2022   PLT 285 02/13/2022      Latest Ref Rng & Units 06/29/2015    8:50 AM  CMP  Glucose 65 - 99 mg/dL 89   BUN 7 - 20 mg/dL 7   Creatinine 0.40 - 1.00 mg/dL 0.69   Sodium 135 - 146 mmol/L 138   Potassium 3.8 - 5.1 mmol/L 4.6   Chloride 98 - 110 mmol/L 103   CO2 20 - 31 mmol/L 28   Calcium 8.9 - 10.4 mg/dL 9.5   Total Protein 6.3 - 8.2 g/dL 6.9   Total Bilirubin 0.2 - 1.1 mg/dL 0.5   Alkaline Phos 41 - 244 U/L 108   AST 12 - 32 U/L 40   ALT 6 - 19 U/L 65    Edinburgh Score:     No data to display           After visit meds:  Allergies as of 02/15/2022       Reactions   Tomato Hives        Medication List     TAKE these medications    Acetaminophen Extra Strength 500 MG Tabs Commonly known as: TYLENOL Take  1 tablet (500 mg total) by mouth every 6 (six) hours as needed.   Blood Pressure Monitor Misc For regular home bp monitoring during pregnancy   ibuprofen 600 MG tablet Commonly known as: ADVIL Take 1 tablet (600 mg total) by mouth every 6 (six) hours.   PRENATAL GUMMIES PO Take by mouth.        Discharge home in stable condition Infant Feeding: Bottle and Breast Infant Disposition:NICU Discharge instruction: per After Visit Summary and Postpartum booklet. Activity: Advance as tolerated. Pelvic rest for 6 weeks.  Diet: routine diet Future Appointments:No future appointments. Follow up Visit: to be scheduled.  Wilhemina Cash, MD PGY-1 Family Medicine Resident [employer]  Fellow Attestation  I saw and evaluated the patient, performing the key elements of the service.I  personally performed or re-performed the history, physical exam, and medical decision making activities of this service and  have verified that the service and findings are accurately documented in the resident's note. I developed the management plan that is described in the resident's note, and I agree with the content, with my edits above.   Doing well overall. Discharge home today. Baby in NICU.  Stormy Card, MD,MPH OB Fellow, Faculty Practice  02/15/2022 9:40 AM

## 2022-02-15 NOTE — Lactation Note (Signed)
This note was copied from a baby's chart.  NICU Lactation Consultation Note  Patient Name: Girl Kym Scannell NOBSJ'G Date: 02/15/2022 Age:20 hours   Subjective Reason for consult: Follow-up assessment; Maternal discharge Pt has pumped infrequently since delivery. She does not have a pump for at-home use and is aware to f/u with [facility] for pump. I demonstrated use of hand pump and encouraged her to use pump in NICU as often as possible until receipt of at-home pump.   Pt has collected drops of colostrum but denies onset of copious milk at this time. She is aware of pumping best-practices and aware of LC support in NICU prn.   Objective Infant data: Mother's Current Feeding Choice: Breast Milk and Donor Milk  Infant feeding assessment Scale for Readiness: 3   Maternal data: G1P1001  Vaginal, Spontaneous Pumping frequency: 3x since delivery with drops on day 2 Pumped volume: 2 mL Flange Size: 24  Risk factor for low milk supply:: infrequent pumping, NICU admission, no at-home pump for use at maternal d/c   WIC Program: Yes WIC Referral Sent?: Yes  Assessment Maternal: Milk volume: Normal  At risk for low milk supply and at-risk for engorgement because of infrequent pumping and no at-home electric pump for use at this time.   Intervention/Plan Interventions: Tour manager education; Education  Tools: Pump; Flanges Pump Education: Setup, frequency, and cleaning; Milk Storage  Plan: Consult Status: NICU follow-up  NICU Follow-up type: Verify absence of engorgement; Verify onset of copious milk  Pt to f/u with WIC Pt to increase pumping to q3h  Gwynne Edinger 02/15/2022, 10:44 AM

## 2022-02-15 NOTE — Progress Notes (Signed)
CSW made a report to [place] CPS for positive UDS for THC.   No barriers to discharge.   Keevin Panebianco, LCSW Clinical Social Worker [facility] Cell#: [phone]   

## 2022-02-15 NOTE — Progress Notes (Signed)
Patient has been napping and very drowsy now. Patient does not her Atarax at this time, stating that she wants to be alert and awake to see baby.

## 2022-02-15 NOTE — Progress Notes (Signed)
POSTPARTUM PROGRESS NOTE  Post Partum Day 2  Subjective:  Monica Kramer is a 21 y.o. G1P1001 s/p VD at [redacted]w[redacted]d.  She reports she is doing well. No acute events overnight. She denies any problems with ambulating, voiding or po intake. Denies nausea or vomiting.  Pain is well controlled.  Lochia is improving.  Objective: Blood pressure 112/61, pulse 70, temperature 98.4 F (36.9 C), resp. rate 14, height 5\' 3"  (1.6 m), weight 71.1 kg, last menstrual period 06/07/2021, SpO2 100 %, unknown if currently breastfeeding.  Physical Exam:  General: alert, cooperative and no distress Chest: no respiratory distress Heart:regular rate, distal pulses intact Abdomen: soft, nontender,  Uterine Fundus: firm, appropriately tender DVT Evaluation: No calf swelling or tenderness Extremities: No edema Skin: warm, dry  Recent Labs    02/13/22 0107 02/14/22 0619  HGB 12.5 11.9*  HCT 35.3* 34.3*    Assessment/Plan: Monica Kramer is a 21 y.o. G1P1001 s/p VD at [redacted]w[redacted]d   PPD#2 - Doing well  Routine postpartum care Hb stable GC/CT S/p CTX and Azithromycin Baby in NICU Contraception: Depo Feeding: Breast/Bottle Dispo: Plan for discharge today.   LOS: 3 days   Wilhemina Cash, MD PGY-1 Family Medicine Resident [place] 02/15/2022, 7:03 AM

## 2022-02-18 ENCOUNTER — Ambulatory Visit: Payer: Self-pay

## 2022-02-18 NOTE — Lactation Note (Signed)
This note was copied from a baby's chart.  NICU Lactation Consultation Note  Patient Name: Monica Kramer MEQAS'T Date: 02/18/2022 Age:21 years   Subjective Reason for consult: Follow-up assessment; Primapara; 1st time breastfeeding; NICU baby; Late-preterm 34-36.6wks; Infant < 6lbs; Breastfeeding assistance  Lactation assisted birth parent, Monica Kramer, with latch attempt. We placed infant, Monica Kramer, STS on parent's chest. Infant was quiet alert with some rooting. We lowered her (allowing her to root) to the right breast. I showed parent how to hold baby in cross cradle hold and we reviewed hand expression. I encouraged Monica Kramer to allow baby to lick breast and to express milk.  Baby remained quiet alert with rooting cues at breast. She opened her mouth numerous times and held the nipple in her mouth. We did not note suckling behavior at this visit.  I encouraged parent to continue to offer infant opportunities to explore the breast during feeding times and to observe for feeding cues. Lactation to follow up again on 10/30 at 1400.  Objective Infant data: Mother's Current Feeding Choice: Breast Milk  Infant feeding assessment Scale for Readiness: 2  Maternal data: G1P1001  Vaginal, Spontaneous Current breast feeding challenges:: NICU  Does the patient have breastfeeding experience prior to this delivery?: No  Pumping frequency: recommended q3 hours   WIC Program: Yes WIC Referral Sent?: Yes  Assessment Infant: LATCH Score: 5   Intervention/Plan Interventions: Breast feeding basics reviewed; Hand express; Adjust position; Support pillows; Education  Plan: Consult Status: NICU follow-up  NICU Follow-up type: Weekly NICU follow up    Lenore Manner 02/18/2022, 2:32 PM

## 2022-02-19 ENCOUNTER — Ambulatory Visit: Payer: Self-pay

## 2022-02-19 ENCOUNTER — Other Ambulatory Visit: Payer: Medicaid Other

## 2022-02-19 ENCOUNTER — Encounter: Payer: Medicaid Other | Admitting: Obstetrics & Gynecology

## 2022-02-19 NOTE — Lactation Note (Signed)
This note was copied from a baby's chart.  NICU Lactation Consultation Note  Patient Name: Monica Kramer RSWNI'O Date: 02/19/2022 Age:21 years   Subjective Reason for consult: Follow-up assessment; Primapara; 1st time breastfeeding; NICU baby; Late-preterm 34-36.6wks  Lactation followed up with Jerl Mina and infant, [name]. Grandmother was also present.  We reviewed infant hunger cues. At this time, infant did not exhibit hunger cues. I placed her on parent's chest, and she slept. I showed parent how to gently lower baby to breast and how to hold baby in cross cradle hold. Parent was able to hand express her mature milk onto baby's lips.  We discussed this process of latching baby. I moved baby back to parent's chest for remainder of gavage feed.  I gave Konni some "homework:" - Place baby on chest at feeding times, preferably STS - Observe for infant hunger cues - If cueing, lower to breast and hand express. - Allow infant to lick and practice latching.  Lactation will continue to monitor infant's progress.  Objective Infant data: Mother's Current Feeding Choice: Breast Milk  Infant feeding assessment Scale for Readiness: 3  Maternal data: G1P1001  Vaginal, Spontaneous  Current breast feeding challenges:: NICU; P1  Does the patient have breastfeeding experience prior to this delivery?: No  Pumping frequency: q3 hours Pumped volume: 120 mL   WIC Program: Yes WIC Referral Sent?: Yes  Assessment Infant: LATCH Score: 5   Maternal: Milk volume: Normal   Intervention/Plan Interventions: Breast feeding basics reviewed; Skin to skin; Hand express; Adjust position; Education; Expressed milk  Pump Education: Setup, frequency, and cleaning  Plan: Consult Status: NICU follow-up  NICU Follow-up type: Weekly NICU follow up    Lenore Manner 02/19/2022, 2:15 PM

## 2022-02-21 ENCOUNTER — Ambulatory Visit: Payer: Self-pay

## 2022-02-21 NOTE — Lactation Note (Signed)
This note was copied from a baby's chart. Lactation Consultation Note  I assisted with positioning and observed infant at breast with 34mm nipple shield. I observed uncoordinated suck and intermittent increases of RR between 80-100/min. Infant initially practiced pacing strategies to return to baseline. I took infant off and placed her sts at 8 minutes into the feeding because of increased WOB. I spoke with Michaelle Birks and requested an SLP consult. Mother should likely pump prior to breastfeeding until SLP evaluation.   Patient Name: Monica Kramer SJGGE'Z Date: 02/21/2022   Age:20 days  Gwynne Edinger 02/21/2022, 4:10 PM

## 2022-02-22 ENCOUNTER — Telehealth (HOSPITAL_COMMUNITY): Payer: Self-pay

## 2022-02-22 ENCOUNTER — Other Ambulatory Visit: Payer: Medicaid Other

## 2022-02-22 NOTE — Telephone Encounter (Signed)
Patient reports feeling good. Patient declines questions/concerns about her health and healing.  Baby is in the [facility] NICU.  EPDS score is 3.  Sharyn Lull [facility] 02/22/22,1425

## 2022-02-23 ENCOUNTER — Ambulatory Visit: Payer: Self-pay

## 2022-02-23 NOTE — Lactation Note (Signed)
This note was copied from a baby's chart. Lactation Consultation Note  Patient Name: Monica Kramer WUJWJ'X Date: 02/23/2022 Reason for consult: Follow-up assessment;NICU baby;Infant < 6lbs;Breastfeeding assistance;1st time breastfeeding;Primapara;Early term 37-38.6wks Age:20 days  Visited with family of 88 days old ETI NICU female twice the first time to check on pumping status and the second time for the 5 pm feeding assist. Ms. [place] is a P1 and reports she's not been pumping consistently. Explained the importance of consistent pumping to protect her supply if breastfeeding is her goal at discharge, she voiced understanding. She voiced that the # 21 flanges are working out better for her but that she still hasn't picked up her pump from the [facility] office, she's rooming in with baby. This LC took baby "Monica Kramer" to the R breast in cross cradle hold but she barely opened her mouth, she was a bit sleepy (see LATCH score). No adverse events to report during this attempt. Reviewed pre-feeding activities such as holding baby during gavage feedings and practicing, practicing with the no-flow nipple, pumping goals and IDF 1/2.  Feeding Mother's Current Feeding Choice: Breast Milk and Formula  LATCH Score Latch: Repeated attempts needed to sustain latch, nipple held in mouth throughout feeding, stimulation needed to elicit sucking reflex. (with and without NS # 20; baby would only do a few suckles before stopping, nipple just sitting in baby's mouth)  Audible Swallowing: None  Type of Nipple: Everted at rest and after stimulation  Comfort (Breast/Nipple): Soft / non-tender  Hold (Positioning): Assistance needed to correctly position infant at breast and maintain latch.  LATCH Score: 6  Lactation Tools Discussed/Used Tools: Pump;Flanges Flange Size: 21 Breast pump type: Double-Electric Breast Pump Pump Education: Setup, frequency, and cleaning;Milk Storage Reason for Pumping: ETI in  NICU Pumping frequency: 2 times/24 hours Pumped volume: 45 mL  Interventions Interventions: Breast feeding basics reviewed;Assisted with latch;Hand express;Breast compression;Adjust position;Support pillows;DEBP;Education;Infant Driven Feeding Algorithm education  Plan of care Encouraged pumping every 3 hours, ideally 8 pumping sessions in 24 hours or the closest she can get to it; depending on maternal goals She'll start taking baby to a pumped breast on feeding cues around feeding times whenever baby shows readiness   GOB (maternal) present and supportive. All questions and concerns answered, family to contact [facility] services PRN.  Discharge Pump:  (No pump at home yet, she's been rooming in with baby since her discharge)  Consult Status Consult Status: NICU follow-up Date: 02/23/22 Follow-up type: In-patient   Mirha Brucato Francene Boyers 02/23/2022, 5:44 PM

## 2022-02-26 ENCOUNTER — Other Ambulatory Visit: Payer: Medicaid Other

## 2022-02-26 ENCOUNTER — Encounter: Payer: Medicaid Other | Admitting: Obstetrics & Gynecology

## 2022-03-01 ENCOUNTER — Other Ambulatory Visit: Payer: Medicaid Other

## 2022-03-04 ENCOUNTER — Ambulatory Visit: Payer: Self-pay

## 2022-03-04 NOTE — Lactation Note (Signed)
This note was copied from a baby's chart.  NICU Lactation Consultation Note  Patient Name: Monica Kramer AXKPV'V Date: 03/04/2022 Age:20 wk.o.  Subjective Reason for consult: Follow-up assessment; Primapara; 1st time breastfeeding; NICU baby; Early term 37-38.6wks; Infant < 6lbs  Visited with family of 19 weeks old ETI NICU female, Monica Kramer reported that she hasn't been pumping consistently, the last time she pumped was 2 days ago. No S/S of engorgement at this time. She voiced she wants to focus on bottle feeding from now on so she can take baby home sooner. She hasn't picked up her WIC pump yet, she's been using the one in baby's room. She's unsure if she'll continue pumping post-discharge, let her know that any amount of EBM is still going to be beneficial for baby but that we'll also support her decision regarding baby's feeding choice whether is with breastmilk or formula. Lactation will F/U next week to check on pumping status and current feeding choice. Great GOB present. All questions and concerns answered, family to contact [facility] services PRN.  Objective Infant data: Mother's Current Feeding Choice: Breast Milk and Formula  Infant feeding assessment Scale for Readiness: 2 Scale for Quality: 2  Maternal data: G1P1001  Vaginal, Spontaneous Pumping frequency: She pumped once 2 days ago Pumped volume: 15 mL Flange Size: 21 WIC Program: Yes WIC Referral Sent?: Yes Pump:  (No pump at home yet, she's been rooming in with baby since her discharge)  Assessment Infant: In NICU  Maternal: Milk volume: Low  Intervention/Plan Interventions: Breast feeding basics reviewed; DEBP; Education Tools: Pump; Flanges Pump Education: Setup, frequency, and cleaning; Milk Storage  Plan of care: Consult Status: NICU follow-up  NICU Follow-up type: Weekly NICU follow up   [employer] 03/04/2022, 4:47 PM

## 2022-03-05 ENCOUNTER — Other Ambulatory Visit: Payer: Medicaid Other

## 2022-03-05 ENCOUNTER — Encounter: Payer: Medicaid Other | Admitting: Advanced Practice Midwife

## 2022-03-11 ENCOUNTER — Ambulatory Visit: Payer: Self-pay

## 2022-03-11 NOTE — Lactation Note (Signed)
This note was copied from a baby'Kramer chart.  NICU Lactation Consultation Note  Patient Name: Monica Kramer [id number]'[id number] Date: 03/11/2022 Age:20 wk.o.  Subjective Reason for consult: Follow-up assessment; 1st time breastfeeding; Primapara; NICU baby; Early term 43-38.6wks  Visited with family of 24 weeks old ETI NICU female, Monica Kramer reported that she'Kramer no longer pumping, the last time she pumped was last week when she saw LC. No Kramer/Kramer of engorgement at this time, just occasional leaking during showers. Baby is currently on Similac 27 calorie formula and advancing bottle feedings. No other support person at this time; all questions and concerns answered, lactation services are completed at this time.  Objective Infant data: Mother'Kramer Current Feeding Choice: Formula  Infant feeding assessment Scale for Readiness: 1 Scale for Quality: 2  Maternal data: G1P1001  Vaginal, Spontaneous WIC Program: Yes WIC Referral Sent?: Yes Pump:  (No pump at home yet, she'Kramer been rooming in with baby since her discharge)  Assessment Infant: Feeding Status: Ad lib  Intervention/Plan Plan: Consult Status: Complete   Monica Kramer Monica Kramer 03/11/2022, 5:07 PM

## 2022-03-14 ENCOUNTER — Encounter (HOSPITAL_COMMUNITY): Payer: Self-pay | Admitting: Obstetrics & Gynecology

## 2022-03-19 ENCOUNTER — Ambulatory Visit (INDEPENDENT_AMBULATORY_CARE_PROVIDER_SITE_OTHER): Payer: Medicaid Other | Admitting: Advanced Practice Midwife

## 2022-03-19 DIAGNOSIS — Z30013 Encounter for initial prescription of injectable contraceptive: Secondary | ICD-10-CM | POA: Diagnosis not present

## 2022-03-19 DIAGNOSIS — Z113 Encounter for screening for infections with a predominantly sexual mode of transmission: Secondary | ICD-10-CM | POA: Diagnosis not present

## 2022-03-19 LAB — POCT URINE PREGNANCY: Preg Test, Ur: NEGATIVE

## 2022-03-19 MED ORDER — MEDROXYPROGESTERONE ACETATE 150 MG/ML IM SUSP
150.0000 mg | Freq: Once | INTRAMUSCULAR | Status: AC
  Administered 2022-03-19: 150 mg via INTRAMUSCULAR

## 2022-03-19 MED ORDER — MEDROXYPROGESTERONE ACETATE 150 MG/ML IM SUSP: 150.0000 mg | INTRAMUSCULAR | 3 refills | Status: AC

## 2022-03-19 NOTE — Progress Notes (Addendum)
Post Partum Visit Note   Chief Complaint:   Postpartum Care  History of Present Illness:   Monica Kramer is a 21 y.o. G29P1001 African American female being seen today for a postpartum visit. She is 5 weeks postpartum following a spontaneous vaginal delivery at 37.1 gestational weeks. IOL: Yes, for  FGR . Anesthesia: epidural.  Laceration: none.  Complications: none. Inpatient contraception: no.  Ttreated for GC in hospital.  POC today Pregnancy complicated by severe FGR . Tobacco use: no. Substance use disorder: no. Last pap smear: N/a . Next pap smear due: 08/2022 No LMP recorded.  Postpartum course has been uncomplicated. Bleeding no bleeding. Bowel function is normal. Bladder function is normal. Urinary incontinence? No, fecal incontinence? No Patient is not sexually active. Last sexual activity: prior to birth.   The pregnancy intention screening data noted above was reviewed. Potential methods of contraception were discussed. The patient elected to proceed with depo.   Edinburgh Postpartum Depression Screening: Negative  Edinburgh Postnatal Depression Scale - 03/19/22 1425       Edinburgh Postnatal Depression Scale:  In the Past 7 Days   I have been able to laugh and see the funny side of things. 0    I have looked forward with enjoyment to things. 0    I have blamed myself unnecessarily when things went wrong. 1    I have been anxious or worried for no good reason. 0    I have felt scared or panicky for no good reason. 0    Things have been getting on top of me. 0    I have been so unhappy that I have had difficulty sleeping. 0    I have felt sad or miserable. 0    I have been so unhappy that I have been crying. 0    The thought of harming myself has occurred to me. 0    Edinburgh Postnatal Depression Scale Total 1            Baby's course has been complicated by NICU for FGR . Baby is feeding by bottle. Infant has a pediatrician/family doctor? Yes.  Childcare strategy  if returning to work/school: n/a.  Pt has material needs met for her and baby: Yes.   Review of Systems:   Pertinent items are noted in HPI Denies Abnormal vaginal discharge w/ itching/odor/irritation, headaches, visual changes, shortness of breath, chest pain, abdominal pain, severe nausea/vomiting, or problems with urination or bowel movements. Pertinent History Reviewed:  Reviewed past medical,surgical, obstetrical and family history.  Reviewed problem list, medications and allergies. OB History  Gravida Para Term Preterm AB Living  _0 0 0 1  SAB IAB Ectopic Multiple Live Births  0 0 0 0 1    # Outcome Date GA Lbr Len/2nd Weight Sex Delivery Anes PTL Lv  1 Term 02/13/22 [id number]:20 / 00:20 3 lb 10.2 oz (1.65 kg) F Vag-Spont EPI  LIV   Physical Assessment:   Vitals:   03/19/22 1426 03/19/22 1428  BP: (!) 148/100 (!) 140/90  Pulse: 94 77  Weight:  153 lb (69.4 kg)  Height:  _1  (1.6 m)  Body mass index is 27.1 kg/m.  Objective:  Blood pressure (!) 140/90, pulse 77, height _2  (1.6 m), weight 153 lb (69.4 kg), not currently breastfeeding.  General:  alert, cooperative, and no distress   Breasts:  negative  Lungs: Normal respiratory effort  Heart:  regular rate and rhythm  Abdomen: soft, non-tender,  Vulva:  not evaluated  Vagina: not evaluated  Cervix:  normal  Corpus: Well involuted  Adnexa:  not evaluated  Rectal Exam: no hemorrhoids          No results found for this or any previous visit (from the past 24 hour(s)).  Assessment & Plan:  1) Postpartum exam 2) 5 wks s/p spontaneous vaginal delivery 3) bottle feeding 4) Depression screening 5) Contraception management: depo today  Essential components of care per ACOG recommendations:  1.  Mood and well being:  If positive depression screen, discussed and plan developed.  If using tobacco we discussed reduction/cessation and risk of relapse If current substance abuse, we discussed and referral to  local resources was offered.   2. Infant care and feeding:  If breastfeeding, discussed returning to work, pumping, breastfeeding-associated pain, guidance regarding return to fertility while lactating if not using another method. If needed, patient was provided with a letter to be allowed to pump q 2-3hrs to support lactation in a private location with access to a refrigerator to store breastmilk.   Recommended that all caregivers be immunized for flu, pertussis and other preventable communicable diseases If pt does not have material needs met for her/baby, referred to local resources for help obtaining these.  3. Sexuality, contraception and birth spacing Provided guidance regarding sexuality, management of dyspareunia, and resumption of intercourse Discussed avoiding interpregnancy interval <33mhs and recommended birth spacing of 18 months  4. Sleep and fatigue Discussed coping options for fatigue and sleep disruption Encouraged family/partner/community support of 4 hrs of uninterrupted sleep to help with mood and fatigue  5. Physical recovery  If pt had a C/S, assessed incisional pain and providing guidance on normal vs prolonged recovery If pt had a laceration, perineal healing and pain reviewed.  If urinary or fecal incontinence, discussed management and referred to PT or uro/gyn if indicated  Patient is safe to resume physical activity. Discussed attainment of healthy weight.  6.  Chronic disease management Discussed pregnancy complications if any, and their implications for future childbearing and long-term maternal health. Review recommendations for prevention of recurrent pregnancy complications, such as aspirin to reduce risk of preeclampsia not applicable. Pt had GDM: No. If yes, 2hr GTT scheduled: not applicable. Reviewed medications and non-pregnant dosing including consideration of whether pt is breastfeeding using a reliable resource such as LactMed: not applicable Referred  for f/u w/ PCP or subspecialist providers as indicated: not applicable  7. Health maintenance Mammogram at [id number] earlier if indicated Pap smears as indicated  Meds:  Meds ordered this encounter  Medications   medroxyPROGESTERone (DEPO-PROVERA) 150 MG/ML injection    Sig: Inject 1 mL (150 mg total) into the muscle every 3 (three) months.    Dispense:  1 mL    Refill:  3    Order Specific Question:   Supervising Provider    Answer:   EFlorian Buff[[id number]]    Follow-up: Return for wed for a RN BP check (virtual). 11 weeks for depo.   Orders Placed This Encounter  Procedures   GC/Chlam[place] [facility] [place] 03/19/2022 3:07 PM

## 2022-03-19 NOTE — Addendum Note (Signed)
Addended by: Annamarie Dawley on: 03/19/2022 03:17 PM   Modules accepted: Orders

## 2022-03-21 ENCOUNTER — Telehealth: Payer: Medicaid Other

## 2022-03-21 LAB — GC/CHLAMYDIA PROBE AMP
Chlamydia trachomatis, NAA: NEGATIVE
Neisseria Gonorrhoeae by PCR: NEGATIVE

## 2022-05-10 ENCOUNTER — Ambulatory Visit (INDEPENDENT_AMBULATORY_CARE_PROVIDER_SITE_OTHER): Payer: Medicaid Other | Admitting: Advanced Practice Midwife

## 2022-05-10 ENCOUNTER — Encounter: Payer: Self-pay | Admitting: Advanced Practice Midwife

## 2022-05-10 VITALS — BP 123/75 | HR 95 | Ht 62.0 in | Wt 160.0 lb

## 2022-05-10 DIAGNOSIS — N921 Excessive and frequent menstruation with irregular cycle: Secondary | ICD-10-CM

## 2022-05-10 NOTE — Progress Notes (Signed)
[facility] Visit  Patient name: EVYN KOOYMAN MRN [id number]  Date of birth: [birth year]  CC & HPI:  Monica Kramer is a 22 y.o.  female presenting today for bleeding.  Had baby 10.24.23, got depo on 11/27. Light off and on bleeding, not heavy, just annoying.  Not breastfeeding.   Pertinent History Reviewed:  Medical & Surgical Hx:   Past Medical History:  Diagnosis Date   Medical history non-contributory    Seasonal allergies    History reviewed. No pertinent surgical history. Family History  Problem Relation Age of Onset   Hypertension Mother    Hypertension Father    Birth defects Paternal Uncle    Cancer Maternal Grandmother        breaat   Hypertension Paternal Grandmother     Current Outpatient Medications:    medroxyPROGESTERone (DEPO-PROVERA) 150 MG/ML injection, Inject 1 mL (150 mg total) into the muscle every 3 (three) months., Disp: 1 mL, Rfl: 3 Social History: Reviewed -  reports that she has never smoked. She has never used smokeless tobacco.  Review of Systems:   Constitutional: Negative for fever and chills Eyes: Negative for visual disturbances Respiratory: Negative for shortness of breath, dyspnea Cardiovascular: Negative for chest pain or palpitations  Gastrointestinal: Negative for vomiting, diarrhea and constipation; no abdominal pain Genitourinary: Negative for dysuria and urgency, vaginal irritation or itching Musculoskeletal: Negative for back pain, joint pain, myalgias  Neurological: Negative for dizziness and headaches    Objective Findings:    Physical Examination: Vitals:   05/10/22 1510  BP: 123/75  Pulse: 95   General appearance - well appearing, and in no distress Mental status - alert, oriented to person, place, and time Chest:  Normal respiratory effort Heart - normal rate and regular rhythm Abdomen:  Soft, nontender Pelvic: SSE:  normal appearing DC, no odor, cx non friable. No blood Musculoskeletal:  Normal  range of motion without pain Extremities:  No edema    No results found for this or any previous visit (from the past 24 hour(s)).    Assessment & Plan:  A:   Breakthrough bleeding on depo P:  Call for [facility] if bleeding gets heavy   No follow-ups on file.  Christin Fudge CNM 05/10/2022 3:18 PM

## 2022-05-14 ENCOUNTER — Other Ambulatory Visit: Payer: Self-pay | Admitting: Advanced Practice Midwife

## 2022-05-14 MED ORDER — MEGESTROL ACETATE 40 MG PO TABS: ORAL_TABLET | ORAL | 3 refills | Status: AC

## 2022-05-14 NOTE — Progress Notes (Signed)
[facility] for BTB on depo

## 2022-06-04 ENCOUNTER — Ambulatory Visit: Payer: Medicaid Other

## 2022-12-22 ENCOUNTER — Emergency Department (HOSPITAL_COMMUNITY)
Admission: EM | Admit: 2022-12-22 | Discharge: 2022-12-22 | Disposition: A | Discharge status: Home / Self Care | Payer: Medicaid Other | Attending: Emergency Medicine | Admitting: Emergency Medicine

## 2022-12-22 ENCOUNTER — Other Ambulatory Visit: Payer: Self-pay

## 2022-12-22 ENCOUNTER — Encounter (HOSPITAL_COMMUNITY): Payer: Self-pay

## 2022-12-22 DIAGNOSIS — Z202 Contact with and (suspected) exposure to infections with a predominantly sexual mode of transmission: Secondary | ICD-10-CM | POA: Insufficient documentation

## 2022-12-22 DIAGNOSIS — I10 Essential (primary) hypertension: Secondary | ICD-10-CM | POA: Diagnosis not present

## 2022-12-22 DIAGNOSIS — R030 Elevated blood-pressure reading, without diagnosis of hypertension: Secondary | ICD-10-CM | POA: Insufficient documentation

## 2022-12-22 LAB — URINALYSIS, ROUTINE W REFLEX MICROSCOPIC
Bilirubin Urine: NEGATIVE
Glucose, UA: NEGATIVE mg/dL
Hgb urine dipstick: NEGATIVE
Ketones, ur: NEGATIVE mg/dL
Leukocytes,Ua: NEGATIVE
Nitrite: NEGATIVE
Protein, ur: NEGATIVE mg/dL
Specific Gravity, Urine: 1.009 (ref 1.005–1.030)
pH: 6 (ref 5.0–8.0)

## 2022-12-22 LAB — WET PREP, GENITAL
Clue Cells Wet Prep HPF POC: NONE SEEN
Sperm: NONE SEEN
Trich, Wet Prep: NONE SEEN
WBC, Wet Prep HPF POC: >=10 — AB (ref <10)
Yeast Wet Prep HPF POC: NONE SEEN

## 2022-12-22 LAB — PREGNANCY, URINE: Preg Test, Ur: NEGATIVE

## 2022-12-22 LAB — RPR: RPR Ser Ql: NONREACTIVE

## 2022-12-22 LAB — HIV ANTIBODY (ROUTINE TESTING W REFLEX): HIV Screen 4th Generation wRfx: NONREACTIVE

## 2022-12-22 MED ORDER — CEFTRIAXONE SODIUM 500 MG IJ SOLR
500.0000 mg | Freq: Once | INTRAMUSCULAR | Status: AC
  Administered 2022-12-22: 500 mg via INTRAMUSCULAR
  Filled 2022-12-22: qty 500

## 2022-12-22 MED ORDER — DOXYCYCLINE HYCLATE 100 MG PO CAPS: 100.0000 mg | ORAL_CAPSULE | Freq: Two times a day (BID) | ORAL | 0 refills | Status: DC

## 2022-12-22 MED ORDER — DOXYCYCLINE HYCLATE 100 MG PO TABS
100.0000 mg | ORAL_TABLET | Freq: Once | ORAL | Status: AC
  Administered 2022-12-22: 100 mg via ORAL
  Filled 2022-12-22: qty 1

## 2022-12-22 NOTE — ED Provider Notes (Signed)
[place] [facility]MENT AT [facility] Provider Note   CSN: [id number] Arrival date & time: 12/22/22  0015     History  Chief Complaint  Patient presents with   Exposure to STD    Monica Kramer is a 22 y.o. female.  The history is provided by the patient.  Exposure to STD  She had unprotected sex last week and her partner told her that he had an STD.  Patient has no symptoms.  She denies any vaginal discharge, pelvic pain, vaginal pain, urinary symptoms.  She is not using any contraception.   Home Medications Prior to Admission medications   Medication Sig Start Date End Date Taking? Authorizing Provider  medroxyPROGESTERone (DEPO-PROVERA) 150 MG/ML injection Inject 1 mL (150 mg total) into the muscle every 3 (three) months. 03/19/22   Cresenzo-Dishmon, Scarlette Calico, CNM  megestrol (MEGACE) 40 MG tablet Take 3/day (at the same time) for 5 days; 2/day for 5 days, then 1/day PO prn bleeding 05/14/22   Cresenzo-Dishmon, Scarlette Calico, CNM      Allergies    Tomato    Review of Systems   Review of Systems  All other systems reviewed and are negative.   Physical Exam Updated Vital Signs BP (!) 131/110 (BP Location: Left Arm)   Pulse (!) 103   Temp 98.6 F (37 C) (Oral)   Resp 18   Ht 5\' 2"  (1.575 m)   Wt 68 kg   LMP 11/24/2022 (Approximate)   SpO2 100%   BMI 27.44 kg/m  Physical Exam Vitals and nursing note reviewed.   22 year old female, resting comfortably and in no acute distress. Vital signs are significant for elevated blood pressure and borderline elevated heart rate. Oxygen saturation is 100%, which is normal. Head is normocephalic and atraumatic. PERRLA, EOMI.  Lungs are clear without rales, wheezes, or rhonchi. Chest is nontender. Heart has regular rate and rhythm without murmur. Abdomen is soft, flat, nontender. Skin is warm and dry without rash. Neurologic: Mental status is normal, cranial nerves are intact, moves all extremities  equally.  ED Results / Procedures / Treatments   Labs (all labs ordered are listed, but only abnormal results are displayed) Labs Reviewed  WET PREP, GENITAL - Abnormal; Notable for the following components:      Result Value   WBC, Wet Prep HPF POC >=10 (*)    All other components within normal limits  URINALYSIS, ROUTINE W REFLEX MICROSCOPIC - Abnormal; Notable for the following components:   APPearance HAZY (*)    All other components within normal limits  PREGNANCY, URINE  RPR  HIV ANTIBODY (ROUTINE TESTING W REFLEX)  GC/CHLAMYDIA PROBE AMP ([place]) NOT AT [facility]   Procedures Procedures    Medications Ordered in ED Medications  cefTRIAXone (ROCEPHIN) injection 500 mg (500 mg Intramuscular Given 12/22/22 0202)  doxycycline (VIBRA-TABS) tablet 100 mg (100 mg Oral Given 12/22/22 0241)    ED Course/ Medical Decision Making/ A&P                                 Medical Decision Making Amount and/or Complexity of Data Reviewed Labs: ordered.  Risk Prescription drug management.   Possible exposure to sexually transmitted infection.  I have ordered sexually-transmitted infection panel including GC and chlamydia PCR, RPR, HIV test.  I have also ordered a wet prep.  Urinalysis is normal.  I have ordered a pregnancy test.  I  have offered patient the option of getting treated now versus waiting for culture results and she has requested immediate treatment.  I have ordered a dose of ceftriaxone, will hold off on doxycycline until I get confirmation of a negative pregnancy test.  I have reviewed her past records, and she had infection with chlamydia on 09/20/2021 and infection with both chlamydia and gonorrhea on 02/06/2022.  Pregnancy test is negative, I have ordered a dose of doxycycline.  Wet prep shows no evidence of bacterial vaginosis or trichomonas, no need to add metronidazole.  I am discharging her with a prescription for doxycycline as well as safe sex precautions.  Final  Clinical Impression(s) / ED Diagnoses Final diagnoses:  STD exposure  Elevated blood pressure reading without diagnosis of hypertension    Rx / DC Orders ED Discharge Orders          Ordered    doxycycline (VIBRAMYCIN) 100 MG capsule  2 times daily        12/22/22 0231              Dione Booze, MD 12/22/22 [phone]

## 2022-12-22 NOTE — ED Triage Notes (Signed)
Pt arrived via POV c/o possible STD from her partner. Pt verbally described her partner as stating "he has a Development worker, international aid."

## 2022-12-22 NOTE — Discharge Instructions (Signed)
Your blood pressure was high today.  That may be related to the stress of coming to the hospital emergency department.  However, could also be because you actually have high blood pressure.  Please have your blood pressure checked several times over the next 1-2 weeks.  If it continues to be high, you may need to be treated for it.  Untreated high blood pressure can lead to heart attacks, strokes, kidney failure.

## 2022-12-22 NOTE — ED Notes (Addendum)
Pt states, "I was told to come in and get tested." Pt denies pain, foul odor, itching, discharge, rashes, and unusual bleeding.

## 2022-12-27 LAB — GC/CHLAMYDIA PROBE AMP (~~LOC~~) NOT AT ARMC
Chlamydia: POSITIVE — AB
Comment: NEGATIVE
Comment: NORMAL
Neisseria Gonorrhea: NEGATIVE

## 2023-01-25 ENCOUNTER — Telehealth: Payer: Self-pay

## 2023-01-25 NOTE — Telephone Encounter (Signed)
LVM asking patient to Call back. Patient does not have INS, contact patient to enroll in [facility]

## 2023-07-10 ENCOUNTER — Ambulatory Visit
Admission: EM | Admit: 2023-07-10 | Discharge: 2023-07-10 | Disposition: A | Discharge status: Home / Self Care | Attending: Nurse Practitioner | Admitting: Nurse Practitioner

## 2023-07-10 DIAGNOSIS — L02421 Furuncle of right axilla: Secondary | ICD-10-CM

## 2023-07-10 MED ORDER — DOXYCYCLINE HYCLATE 100 MG PO TABS: 100.0000 mg | ORAL_TABLET | Freq: Two times a day (BID) | ORAL | 0 refills | Status: AC

## 2023-07-10 NOTE — Discharge Instructions (Signed)
 Take medication as prescribed. Warm compresses to the affected area 3-4 times daily. Clean the area at least twice daily with Dial Gold bar soap or another type of antibacterial soap. Keep the area covered if it begins to drain. Follow-up if you continue to experience pain, increased swelling, or the area begins to look like a pimple. Go to the emergency department if you develop fever, chills, generalized fatigue, nausea, vomiting, or if the area of redness spreads further into the armpit, or down the right arm, or if you have developed foul-smelling drainage from the site.  Follow-up as needed.

## 2023-07-10 NOTE — ED Triage Notes (Signed)
 Pt reports she has a boil under her right arm x 1 week

## 2023-07-10 NOTE — ED Provider Notes (Signed)
[facility]    CSN: [id number] Arrival date & time: 07/10/23  1604      History   Chief Complaint No chief complaint on file.   HPI Monica Kramer is a 23 y.o. female.   The history is provided by the patient.   Patient presents with pain and swelling under the right axilla that is been present for the past week.  Patient denies fever, chills, chest pain, abdominal pain, nausea, vomiting, diarrhea, or rash.  Patient reports she experienced the same or similar symptoms several years ago.  Patient denies history of diabetes.  Patient reports that she does shave under her arms, she was also told at the time of the last episode that she would need to consider changing her deodorants.  Past Medical History:  Diagnosis Date   Medical history non-contributory    Seasonal allergies     Patient Active Problem List   Diagnosis Date Noted   Gonorrhea affecting pregnancy 02/13/2022    History reviewed. No pertinent surgical history.  OB History     Gravida  1   Para  1   Term  1   Preterm  0   AB  0   Living  1      SAB  0   IAB  0   Ectopic  0   Multiple  0   Live Births  1            Home Medications    Prior to Admission medications   Medication Sig Start Date End Date Taking? Authorizing Provider  doxycycline (VIBRAMYCIN) 100 MG capsule Take 1 capsule (100 mg total) by mouth 2 (two) times daily. One po bid x 7 days 12/22/22   Dione Booze, MD  medroxyPROGESTERone (DEPO-PROVERA) 150 MG/ML injection Inject 1 mL (150 mg total) into the muscle every 3 (three) months. 03/19/22   Cresenzo-Dishmon, Scarlette Calico, CNM  megestrol (MEGACE) 40 MG tablet Take 3/day (at the same time) for 5 days; 2/day for 5 days, then 1/day PO prn bleeding 05/14/22   Cresenzo-Dishmon, Scarlette Calico, CNM    Family History Family History  Problem Relation Age of Onset   Hypertension Mother    Hypertension Father    Birth defects Paternal Uncle    Cancer Maternal Grandmother         breaat   Hypertension Paternal Grandmother     Social History Social History   Tobacco Use   Smoking status: Never   Smokeless tobacco: Never  Vaping Use   Vaping status: Former  Substance Use Topics   Alcohol use: Never    Alcohol/week: 0.0 standard drinks of alcohol   Drug use: Not Currently    Types: Marijuana     Allergies   Tomato   Review of Systems Review of Systems Per HPI  Physical Exam Triage Vital Signs ED Triage Vitals  Encounter Vitals Group     BP 07/10/23 1619 121/72     Systolic BP Percentile --      Diastolic BP Percentile --      Pulse Rate 07/10/23 1619 98     Resp 07/10/23 1619 14     Temp 07/10/23 1619 98.4 F (36.9 C)     Temp Source 07/10/23 1619 Oral     SpO2 07/10/23 1619 96 %     Weight --      Height --      Head Circumference --      Peak Flow --  Pain Score 07/10/23 1618 0     Pain Loc --      Pain Education --      Exclude from Growth Chart --    No data found.  Updated Vital Signs BP 121/72 (BP Location: Right Arm)   Pulse 98   Temp 98.4 F (36.9 C) (Oral)   Resp 14   LMP 07/02/2023 (Approximate)   SpO2 96%   Visual Acuity Right Eye Distance:   Left Eye Distance:   Bilateral Distance:    Right Eye Near:   Left Eye Near:    Bilateral Near:     Physical Exam Vitals and nursing note reviewed.  Constitutional:      General: She is not in acute distress.    Appearance: Normal appearance.  HENT:     Head: Normocephalic.  Eyes:     Extraocular Movements: Extraocular movements intact.     Conjunctiva/sclera: Conjunctivae normal.     Pupils: Pupils are equal, round, and reactive to light.  Cardiovascular:     Rate and Rhythm: Normal rate and regular rhythm.     Pulses: Normal pulses.     Heart sounds: Normal heart sounds.  Pulmonary:     Effort: Pulmonary effort is normal. No respiratory distress.     Breath sounds: Normal breath sounds. No stridor. No wheezing, rhonchi or rales.  Abdominal:      General: Bowel sounds are normal.     Palpations: Abdomen is soft.  Musculoskeletal:     Cervical back: Normal range of motion.  Lymphadenopathy:     Cervical: No cervical adenopathy.  Skin:    General: Skin is warm and dry.     Comments: Approximate 3cm x 2cm annular induration noted to the right axilla. Area is firm, tender to palpation.  No surrounding erythema present.  There is no oozing, fluctuance or drainage present.    Neurological:     General: No focal deficit present.     Mental Status: She is alert and oriented to person, place, and time.  Psychiatric:        Mood and Affect: Mood normal.        Behavior: Behavior normal.      UC Treatments / Results  Labs (all labs ordered are listed, but only abnormal results are displayed) Labs Reviewed - No data to display  EKG   Radiology No results found.  Procedures Procedures (including critical care time)  Medications Ordered in UC Medications - No data to display  Initial Impression / Assessment and Plan / UC Course  I have reviewed the triage vital signs and the nursing notes.  Pertinent labs & imaging results that were available during my care of the patient were reviewed by me and considered in my medical decision making (see chart for details).  Patient with furuncle under the right axilla.  Will treat with doxycycline 100 mg twice daily for the next 7 days.  Supportive care recommendations were provided and discussed with the patient to include over-the-counter analgesics, warm compresses, and to cleanse the area with antibacterial soap.  Discussed indications regarding follow-up.  Patient was in agreement with this plan of care and verbalized understanding.  All questions were answered.  Patient stable for discharge.  Final Clinical Impressions(s) / UC Diagnoses   Final diagnoses:  None   Discharge Instructions   None    ED Prescriptions   None    PDMP not reviewed this encounter.    Leath-Warren, Sadie Haber, NP  07/10/23 1646
